# Patient Record
Sex: Female | Born: 1945 | Race: White | Hispanic: No | Marital: Married | Smoking: Former smoker
Health system: Southern US, Community
[De-identification: ages and names within clinical notes are randomized; demographics above are authoritative.]

## PROBLEM LIST (undated history)

## (undated) DIAGNOSIS — I10 Essential (primary) hypertension: Secondary | ICD-10-CM

## (undated) DIAGNOSIS — R011 Cardiac murmur, unspecified: Secondary | ICD-10-CM

## (undated) DIAGNOSIS — M719 Bursopathy, unspecified: Secondary | ICD-10-CM

## (undated) DIAGNOSIS — M1990 Unspecified osteoarthritis, unspecified site: Secondary | ICD-10-CM

## (undated) DIAGNOSIS — I8390 Asymptomatic varicose veins of unspecified lower extremity: Secondary | ICD-10-CM

## (undated) DIAGNOSIS — E785 Hyperlipidemia, unspecified: Secondary | ICD-10-CM

## (undated) DIAGNOSIS — Z8719 Personal history of other diseases of the digestive system: Secondary | ICD-10-CM

## (undated) DIAGNOSIS — M810 Age-related osteoporosis without current pathological fracture: Secondary | ICD-10-CM

## (undated) DIAGNOSIS — K635 Polyp of colon: Secondary | ICD-10-CM

## (undated) DIAGNOSIS — R3129 Other microscopic hematuria: Secondary | ICD-10-CM

## (undated) DIAGNOSIS — K219 Gastro-esophageal reflux disease without esophagitis: Secondary | ICD-10-CM

## (undated) DIAGNOSIS — M797 Fibromyalgia: Secondary | ICD-10-CM

## (undated) DIAGNOSIS — Z9889 Other specified postprocedural states: Secondary | ICD-10-CM

## (undated) HISTORY — PX: RETINAL DETACHMENT SURGERY: SHX105

## (undated) HISTORY — DX: Other microscopic hematuria: R31.29

## (undated) HISTORY — DX: Personal history of other diseases of the digestive system: Z87.19

## (undated) HISTORY — DX: Bursopathy, unspecified: M71.9

## (undated) HISTORY — DX: Fibromyalgia: M79.7

## (undated) HISTORY — DX: Polyp of colon: K63.5

## (undated) HISTORY — DX: Age-related osteoporosis without current pathological fracture: M81.0

## (undated) HISTORY — DX: Hyperlipidemia, unspecified: E78.5

## (undated) HISTORY — DX: Essential (primary) hypertension: I10

## (undated) HISTORY — DX: Unspecified osteoarthritis, unspecified site: M19.90

## (undated) HISTORY — DX: Asymptomatic varicose veins of unspecified lower extremity: I83.90

## (undated) HISTORY — DX: Other specified postprocedural states: Z98.890

---

## 1998-06-13 ENCOUNTER — Other Ambulatory Visit: Admission: RE | Admit: 1998-06-13 | Discharge: 1998-06-13 | Payer: Self-pay | Admitting: *Deleted

## 1999-06-24 ENCOUNTER — Other Ambulatory Visit: Admission: RE | Admit: 1999-06-24 | Discharge: 1999-06-24 | Payer: Self-pay | Admitting: *Deleted

## 2000-06-23 ENCOUNTER — Other Ambulatory Visit: Admission: RE | Admit: 2000-06-23 | Discharge: 2000-06-23 | Payer: Self-pay | Admitting: *Deleted

## 2001-02-23 ENCOUNTER — Ambulatory Visit (HOSPITAL_COMMUNITY): Admission: RE | Admit: 2001-02-23 | Discharge: 2001-02-23 | Payer: Self-pay | Admitting: Gastroenterology

## 2001-02-23 ENCOUNTER — Encounter (INDEPENDENT_AMBULATORY_CARE_PROVIDER_SITE_OTHER): Payer: Self-pay | Admitting: Specialist

## 2001-06-29 ENCOUNTER — Other Ambulatory Visit: Admission: RE | Admit: 2001-06-29 | Discharge: 2001-06-29 | Payer: Self-pay | Admitting: *Deleted

## 2002-06-14 ENCOUNTER — Other Ambulatory Visit: Admission: RE | Admit: 2002-06-14 | Discharge: 2002-06-14 | Payer: Self-pay | Admitting: *Deleted

## 2003-06-25 ENCOUNTER — Other Ambulatory Visit: Admission: RE | Admit: 2003-06-25 | Discharge: 2003-06-25 | Payer: Self-pay | Admitting: *Deleted

## 2004-01-30 ENCOUNTER — Ambulatory Visit (HOSPITAL_COMMUNITY): Admission: RE | Admit: 2004-01-30 | Discharge: 2004-01-31 | Payer: Self-pay | Admitting: Ophthalmology

## 2004-06-25 ENCOUNTER — Other Ambulatory Visit: Admission: RE | Admit: 2004-06-25 | Discharge: 2004-06-25 | Payer: Self-pay | Admitting: *Deleted

## 2005-06-24 ENCOUNTER — Other Ambulatory Visit: Admission: RE | Admit: 2005-06-24 | Discharge: 2005-06-24 | Payer: Self-pay | Admitting: *Deleted

## 2006-06-29 ENCOUNTER — Other Ambulatory Visit: Admission: RE | Admit: 2006-06-29 | Discharge: 2006-06-29 | Payer: Self-pay | Admitting: *Deleted

## 2007-07-13 ENCOUNTER — Other Ambulatory Visit: Admission: RE | Admit: 2007-07-13 | Discharge: 2007-07-13 | Payer: Self-pay | Admitting: *Deleted

## 2010-10-06 ENCOUNTER — Other Ambulatory Visit: Admission: RE | Admit: 2010-10-06 | Discharge: 2010-10-06 | Payer: Self-pay | Admitting: Internal Medicine

## 2011-04-24 NOTE — Op Note (Signed)
NAMELillie Boone NO.:  [id number]   MEDICAL RECORD NO.:  [id number]                   PATIENT TYPE:  OIB   LOCATION:  5736                                 FACILITY:  [facility]   PHYSICIAN:  Guadelupe Sabin, M.D.             DATE OF BIRTH:  1946-06-19   DATE OF PROCEDURE:  01/30/2004  DATE OF DISCHARGE:  01/31/2004                                 OPERATIVE REPORT   PREOPERATIVE DIAGNOSIS:  Rhegmatogenous retinal detachment, left eye.   POSTOPERATIVE DIAGNOSIS:  Rhegmatogenous retinal detachment, left eye.   NAME OF OPERATION:  Scleral buckling procedure, left eye, using solid  silicone implants, #277 and 240. Cryoapplication and diathermy application,  external drainage of subretinal fluid, intravitreous air injection.   SURGEON:  Guadelupe Sabin, M.D.   ASSISTANT:  Nurse.   ANESTHESIA:  General.   OPHTHALMOSCOPY:  AS previously described.   OPERATIVE PROCEDURE:  After the patient was prepped and draped, retraction  sutures were placed in the left upper and lower lids. A lid speculum was  inserted. A peritomy was performed adjacent to the limbus, 360 degrees. The  rectus muscles were isolated with 4-0 silk traction sutures. The sclerae was  inspected and felt to be of satisfactory thickness for a laminar scleral  dissection. Localization was then carried out using the retinal cryoprobe.  The tear area was localized at the 12:30 position as previously drawn. There  appeared to be multiple tiny retinal holes or tears in this area. The area  was treated with direct cryoapplication. It was then elected to perform  laminar scleral dissection from the 11 to 2:30 position, the bed measuring 9  mm in width. Light diathermy applications were applied to the intrascleral  lamina. A total of three 4-0 green [place] sutures were used to close the  flaps loosely with 3-0 plain catgut reinforcements over a trimmed #277 solid  silicone implant. A #240  solid silicone encircling band was placed about the  globe, tied with a suture at the 4 o'clock position. Anchoring sutures of 5-  0 white Dacron were placed at the 4:30 and 8 o'clock position to hold the  encircling band in place at the equator. After repeat indirect  ophthalmoscopy, it was elected to drain fluid in the bed at the 2:30  position at the end of the implant. Incision was made through the  intrascleral lamina and the choroid exposed, treated with light diathermy,  and then perforated with the pain electrode. An abundant amount of clear  subretinal fluid drained. The fundus was reinspected revealing still  considerable subretinal fluid. The drain site was reopened, and further  drainage carried out. The encircling band was adjusted slightly, but the eye  was quite hypotonus. It was therefore elected to inject air into the  vitreous cavity with a 30-gauge needle at the 8 o'clock position. The end of  the implant was  again slightly elevated and further subretinal fluid  drained. The eye once again became hypotonus, and it was elected to inject  approximately a total of 0.7 to 0.8 cc of air into the vitreous cavity.  Following this, it was felt that the retina __________ and retinal tears had  flattened significantly on the implant surface, and the rest of the residual  subretinal fluid would hopefully absorb spontaneously. It was therefore  elected to close. The scleral flaps were pulled up securely, and the tension  of the band inspected and felt to be in good position. Tenon's capsule was  then pulled forward in the four quadrants and tied as a separate layer with  4-0 mild chromic catgut sutures. The conjunctiva was then pulled forward and  closed with a running 6-0 mild chromic catgut suture. Fifty percent  Neosporin ophthalmic solution was irrigated in a subtenon space and over the  cornea. __________ Garamycin and dexamethasone were  injected into the subconjunctival  cul-de-sac. Maxitrol and atropine ointment  were instilled in the conjunctival cul-de-sac and a light patch applied.  Duration of procedure one and half hours. The patient tolerated the  procedure in general, left the operating room for the recovery room and  subsequently to the 23 hour observation unit.                                               Guadelupe Sabin, M.D.    HNJ/MEDQ  D:  01/31/2004  T:  01/31/2004  Job:  [id number]

## 2011-04-24 NOTE — Discharge Summary (Signed)
NAMELillie Fragmin NO.:  [id number]   MEDICAL RECORD NO.:  [id number]                   PATIENT TYPE:  OIB   LOCATION:  5736                                 FACILITY:  [facility]   PHYSICIAN:  Guadelupe Sabin, M.D.             DATE OF BIRTH:  February 26, 1946   DATE OF ADMISSION:  01/30/2004  DATE OF DISCHARGE:  01/31/2004                                 DISCHARGE SUMMARY   This was an urgent outpatient admission of this 65 year old white female  admitted with a rhegmatogenous retinal detachment of the left eye. The  patient had a scleral buckling procedure under general anesthesia without  complication. The patient was seen on the evening of surgery and the  following morning and felt to have achieved maximal hospital benefit. She  therefore was discharged home to be followed in the office.                                                Guadelupe Sabin, M.D.    HNJ/MEDQ  D:  01/31/2004  T:  01/31/2004  Job:  [id number]

## 2011-04-24 NOTE — H&P (Signed)
NAMELillie Boone NO.:  [id number]   MEDICAL RECORD NO.:  [id number]                   PATIENT TYPE:  OIB   LOCATION:  5736                                 FACILITY:  [facility]   PHYSICIAN:  Guadelupe Sabin, M.D.             DATE OF BIRTH:  June 30, 1946   DATE OF ADMISSION:  01/30/2004  DATE OF DISCHARGE:  01/31/2004                                HISTORY & PHYSICAL   REASON FOR ADMISSION:  This was an urgent outpatient admission of this 65-  year-old white female admitted with a rhegmatogenous retinal detachment of  the left eye.   HISTORY OF PRESENT ILLNESS:  This patient states that she was in good visual  health until approximately one week prior to admission when she began to  note the loss of vision and a visual field defect in her left eye. The  patient was seen by Dr. Stark Klein, [facility] optometrist, and found to  have a retinal detachment. The patient was referred to my office where this  diagnosis was confirmed and the patient admitted for surgery.   PAST MEDICAL HISTORY:  The patient is under the care of Dr. Vear Clock.  She is in good general health. The patient smokes one pack of cigarettes per  day.   CURRENT MEDICATIONS:  Advil, multivitamins, calcium, magnesium, circulation  tablets for her legs, and she has been taking TobraDex ophthalmic solution  one drop four times a day from Dr. Lawerance Bach for a possible corneal abrasion.   ALLERGIES:  Seasonal allergies and sinus trouble, PENICILLIN.   FAMILY HISTORY:  Negative for retinal detachment problems.   REVIEW OF SYSTEMS:  No cardiorespiratory complaints.   PHYSICAL EXAMINATION:  VITAL SIGNS:  As recorded on her chart.  GENERAL APPEARANCE:  The patient is a pleasant, thin, white female in acute  ocular distress.  HEENT:  Visual acuity without correction 20/400 right eye, finger counting  left eye; with correction 20/30 +2 right eye, finger counting left eye.  Applanation  tonometry 14 mm right eye, 11 left eye. Slit-lamp examination:  The eyes are white and clear with a clear cornea, deep and clear anterior  chamber, and clear lens. Detailed fundus examination:  Right eye reveals a  clear vitreous attached retina with normal optic nerve, blood vessels, and  macular; the left eye reveals a very bullous rhegmatogenous retinal  detachment extending from the 11:30 to 5:30 position. The macular area is  detached. At the 12:30 position is a retinal tear area or multiple small  tears.  CHEST:  Lungs clear to percussion and auscultation.  HEART:  Normal sinus rhythm. No cardiomegaly. No murmurs.  ABDOMEN:  Negative.  EXTREMITIES:  Negative.   ADMISSION DIAGNOSIS:  Rhegmatogenous retinal detachment, left eye.   SURGICAL PLAN:  Scleral buckling procedure with possible vitrectomy, left  eye. The patient and her husband have been given oral discussion and  printed  information concerning retinal detachment surgery and its complications. The  patient has been told that multiple surgical procedures are a possibility  and that it is a small percentage may not be able to have her retinal  detachment completely repaired. She understands also that her vision,  even with a successful retinal reattachment, may be decreased from her  previous visual acuity status. She has signed an informed consent, and  arrangements were made for outpatient here, as the patient did not wish to  travel to one of the medical schools for surgery.                                                Guadelupe Sabin, M.D.    HNJ/MEDQ  D:  01/31/2004  T:  01/31/2004  Job:  [id number]

## 2013-11-16 ENCOUNTER — Other Ambulatory Visit: Payer: Self-pay | Admitting: Internal Medicine

## 2013-11-16 ENCOUNTER — Other Ambulatory Visit (HOSPITAL_COMMUNITY)
Admission: RE | Admit: 2013-11-16 | Discharge: 2013-11-16 | Disposition: A | Discharge status: Home / Self Care | Payer: No Typology Code available for payment source | Source: Ambulatory Visit | Attending: Internal Medicine | Admitting: Internal Medicine

## 2013-11-16 DIAGNOSIS — Z1151 Encounter for screening for human papillomavirus (HPV): Secondary | ICD-10-CM | POA: Insufficient documentation

## 2013-11-16 DIAGNOSIS — Z01419 Encounter for gynecological examination (general) (routine) without abnormal findings: Secondary | ICD-10-CM | POA: Insufficient documentation

## 2014-12-12 DIAGNOSIS — M810 Age-related osteoporosis without current pathological fracture: Secondary | ICD-10-CM | POA: Diagnosis not present

## 2014-12-12 DIAGNOSIS — K219 Gastro-esophageal reflux disease without esophagitis: Secondary | ICD-10-CM | POA: Diagnosis not present

## 2014-12-12 DIAGNOSIS — I10 Essential (primary) hypertension: Secondary | ICD-10-CM | POA: Diagnosis not present

## 2014-12-12 DIAGNOSIS — Z23 Encounter for immunization: Secondary | ICD-10-CM | POA: Diagnosis not present

## 2014-12-12 DIAGNOSIS — Z0000 Encounter for general adult medical examination without abnormal findings: Secondary | ICD-10-CM | POA: Diagnosis not present

## 2014-12-12 DIAGNOSIS — M797 Fibromyalgia: Secondary | ICD-10-CM | POA: Diagnosis not present

## 2014-12-12 DIAGNOSIS — Z1389 Encounter for screening for other disorder: Secondary | ICD-10-CM | POA: Diagnosis not present

## 2014-12-12 DIAGNOSIS — E780 Pure hypercholesterolemia: Secondary | ICD-10-CM | POA: Diagnosis not present

## 2015-07-29 DIAGNOSIS — D2272 Melanocytic nevi of left lower limb, including hip: Secondary | ICD-10-CM | POA: Diagnosis not present

## 2015-07-29 DIAGNOSIS — L821 Other seborrheic keratosis: Secondary | ICD-10-CM | POA: Diagnosis not present

## 2015-07-29 DIAGNOSIS — D225 Melanocytic nevi of trunk: Secondary | ICD-10-CM | POA: Diagnosis not present

## 2015-07-29 DIAGNOSIS — D2262 Melanocytic nevi of left upper limb, including shoulder: Secondary | ICD-10-CM | POA: Diagnosis not present

## 2015-07-29 DIAGNOSIS — L812 Freckles: Secondary | ICD-10-CM | POA: Diagnosis not present

## 2015-07-29 DIAGNOSIS — L738 Other specified follicular disorders: Secondary | ICD-10-CM | POA: Diagnosis not present

## 2015-07-29 DIAGNOSIS — D1801 Hemangioma of skin and subcutaneous tissue: Secondary | ICD-10-CM | POA: Diagnosis not present

## 2015-07-29 DIAGNOSIS — D2261 Melanocytic nevi of right upper limb, including shoulder: Secondary | ICD-10-CM | POA: Diagnosis not present

## 2015-07-29 DIAGNOSIS — L918 Other hypertrophic disorders of the skin: Secondary | ICD-10-CM | POA: Diagnosis not present

## 2015-09-17 DIAGNOSIS — Z1231 Encounter for screening mammogram for malignant neoplasm of breast: Secondary | ICD-10-CM | POA: Diagnosis not present

## 2015-12-31 ENCOUNTER — Other Ambulatory Visit: Payer: Self-pay | Admitting: Internal Medicine

## 2015-12-31 ENCOUNTER — Ambulatory Visit
Admission: RE | Admit: 2015-12-31 | Discharge: 2015-12-31 | Disposition: A | Discharge status: Home / Self Care | Payer: Medicare Other | Source: Ambulatory Visit | Attending: Internal Medicine | Admitting: Internal Medicine

## 2015-12-31 DIAGNOSIS — M810 Age-related osteoporosis without current pathological fracture: Secondary | ICD-10-CM | POA: Diagnosis not present

## 2015-12-31 DIAGNOSIS — Z1389 Encounter for screening for other disorder: Secondary | ICD-10-CM | POA: Diagnosis not present

## 2015-12-31 DIAGNOSIS — Z23 Encounter for immunization: Secondary | ICD-10-CM | POA: Diagnosis not present

## 2015-12-31 DIAGNOSIS — M25562 Pain in left knee: Secondary | ICD-10-CM | POA: Diagnosis not present

## 2015-12-31 DIAGNOSIS — M797 Fibromyalgia: Secondary | ICD-10-CM | POA: Diagnosis not present

## 2015-12-31 DIAGNOSIS — E7800 Pure hypercholesterolemia, unspecified: Secondary | ICD-10-CM | POA: Diagnosis not present

## 2015-12-31 DIAGNOSIS — K219 Gastro-esophageal reflux disease without esophagitis: Secondary | ICD-10-CM | POA: Diagnosis not present

## 2015-12-31 DIAGNOSIS — Z6830 Body mass index (BMI) 30.0-30.9, adult: Secondary | ICD-10-CM | POA: Diagnosis not present

## 2015-12-31 DIAGNOSIS — Z0000 Encounter for general adult medical examination without abnormal findings: Secondary | ICD-10-CM | POA: Diagnosis not present

## 2015-12-31 DIAGNOSIS — I10 Essential (primary) hypertension: Secondary | ICD-10-CM | POA: Diagnosis not present

## 2015-12-31 DIAGNOSIS — E669 Obesity, unspecified: Secondary | ICD-10-CM | POA: Diagnosis not present

## 2016-01-20 DIAGNOSIS — M7062 Trochanteric bursitis, left hip: Secondary | ICD-10-CM | POA: Diagnosis not present

## 2016-01-20 DIAGNOSIS — M7632 Iliotibial band syndrome, left leg: Secondary | ICD-10-CM | POA: Diagnosis not present

## 2016-02-17 DIAGNOSIS — M7062 Trochanteric bursitis, left hip: Secondary | ICD-10-CM | POA: Diagnosis not present

## 2016-03-19 DIAGNOSIS — M7632 Iliotibial band syndrome, left leg: Secondary | ICD-10-CM | POA: Diagnosis not present

## 2016-03-19 DIAGNOSIS — M7062 Trochanteric bursitis, left hip: Secondary | ICD-10-CM | POA: Diagnosis not present

## 2016-05-08 DIAGNOSIS — R0781 Pleurodynia: Secondary | ICD-10-CM | POA: Diagnosis not present

## 2016-05-14 DIAGNOSIS — M545 Low back pain: Secondary | ICD-10-CM | POA: Diagnosis not present

## 2016-05-20 DIAGNOSIS — M545 Low back pain: Secondary | ICD-10-CM | POA: Diagnosis not present

## 2016-05-22 DIAGNOSIS — M545 Low back pain: Secondary | ICD-10-CM | POA: Diagnosis not present

## 2016-06-03 DIAGNOSIS — M545 Low back pain: Secondary | ICD-10-CM | POA: Diagnosis not present

## 2016-08-25 DIAGNOSIS — K219 Gastro-esophageal reflux disease without esophagitis: Secondary | ICD-10-CM | POA: Diagnosis not present

## 2016-08-25 DIAGNOSIS — I10 Essential (primary) hypertension: Secondary | ICD-10-CM | POA: Diagnosis not present

## 2016-09-14 DIAGNOSIS — Z8719 Personal history of other diseases of the digestive system: Secondary | ICD-10-CM | POA: Diagnosis not present

## 2016-09-14 DIAGNOSIS — Z86010 Personal history of colonic polyps: Secondary | ICD-10-CM | POA: Diagnosis not present

## 2016-09-14 DIAGNOSIS — Z800 Family history of malignant neoplasm of digestive organs: Secondary | ICD-10-CM | POA: Diagnosis not present

## 2016-09-14 DIAGNOSIS — Z8371 Family history of colonic polyps: Secondary | ICD-10-CM | POA: Diagnosis not present

## 2016-09-14 DIAGNOSIS — Z1211 Encounter for screening for malignant neoplasm of colon: Secondary | ICD-10-CM | POA: Diagnosis not present

## 2016-09-17 DIAGNOSIS — Z1231 Encounter for screening mammogram for malignant neoplasm of breast: Secondary | ICD-10-CM | POA: Diagnosis not present

## 2016-09-22 DIAGNOSIS — H2513 Age-related nuclear cataract, bilateral: Secondary | ICD-10-CM | POA: Diagnosis not present

## 2016-09-22 DIAGNOSIS — H524 Presbyopia: Secondary | ICD-10-CM | POA: Diagnosis not present

## 2016-09-23 DIAGNOSIS — M7062 Trochanteric bursitis, left hip: Secondary | ICD-10-CM | POA: Diagnosis not present

## 2017-01-05 DIAGNOSIS — E7800 Pure hypercholesterolemia, unspecified: Secondary | ICD-10-CM | POA: Diagnosis not present

## 2017-01-05 DIAGNOSIS — M797 Fibromyalgia: Secondary | ICD-10-CM | POA: Diagnosis not present

## 2017-01-05 DIAGNOSIS — M810 Age-related osteoporosis without current pathological fracture: Secondary | ICD-10-CM | POA: Diagnosis not present

## 2017-01-05 DIAGNOSIS — Z0000 Encounter for general adult medical examination without abnormal findings: Secondary | ICD-10-CM | POA: Diagnosis not present

## 2017-01-05 DIAGNOSIS — Z1389 Encounter for screening for other disorder: Secondary | ICD-10-CM | POA: Diagnosis not present

## 2017-01-05 DIAGNOSIS — I10 Essential (primary) hypertension: Secondary | ICD-10-CM | POA: Diagnosis not present

## 2017-01-05 DIAGNOSIS — K219 Gastro-esophageal reflux disease without esophagitis: Secondary | ICD-10-CM | POA: Diagnosis not present

## 2017-01-16 DIAGNOSIS — J069 Acute upper respiratory infection, unspecified: Secondary | ICD-10-CM | POA: Diagnosis not present

## 2017-01-16 DIAGNOSIS — R42 Dizziness and giddiness: Secondary | ICD-10-CM | POA: Diagnosis not present

## 2017-09-21 DIAGNOSIS — Z1231 Encounter for screening mammogram for malignant neoplasm of breast: Secondary | ICD-10-CM | POA: Diagnosis not present

## 2017-09-29 DIAGNOSIS — M7062 Trochanteric bursitis, left hip: Secondary | ICD-10-CM | POA: Diagnosis not present

## 2017-12-22 DIAGNOSIS — M7062 Trochanteric bursitis, left hip: Secondary | ICD-10-CM | POA: Diagnosis not present

## 2017-12-30 DIAGNOSIS — H524 Presbyopia: Secondary | ICD-10-CM | POA: Diagnosis not present

## 2017-12-30 DIAGNOSIS — H33302 Unspecified retinal break, left eye: Secondary | ICD-10-CM | POA: Diagnosis not present

## 2017-12-30 DIAGNOSIS — H2513 Age-related nuclear cataract, bilateral: Secondary | ICD-10-CM | POA: Diagnosis not present

## 2018-01-04 DIAGNOSIS — M797 Fibromyalgia: Secondary | ICD-10-CM | POA: Diagnosis not present

## 2018-01-04 DIAGNOSIS — E7800 Pure hypercholesterolemia, unspecified: Secondary | ICD-10-CM | POA: Diagnosis not present

## 2018-01-04 DIAGNOSIS — I10 Essential (primary) hypertension: Secondary | ICD-10-CM | POA: Diagnosis not present

## 2018-01-04 DIAGNOSIS — E663 Overweight: Secondary | ICD-10-CM | POA: Diagnosis not present

## 2018-01-04 DIAGNOSIS — K219 Gastro-esophageal reflux disease without esophagitis: Secondary | ICD-10-CM | POA: Diagnosis not present

## 2018-01-04 DIAGNOSIS — M5441 Lumbago with sciatica, right side: Secondary | ICD-10-CM | POA: Diagnosis not present

## 2018-01-04 DIAGNOSIS — Z23 Encounter for immunization: Secondary | ICD-10-CM | POA: Diagnosis not present

## 2018-01-05 DIAGNOSIS — I10 Essential (primary) hypertension: Secondary | ICD-10-CM | POA: Diagnosis not present

## 2018-02-14 DIAGNOSIS — R05 Cough: Secondary | ICD-10-CM | POA: Diagnosis not present

## 2018-02-14 DIAGNOSIS — J069 Acute upper respiratory infection, unspecified: Secondary | ICD-10-CM | POA: Diagnosis not present

## 2018-02-14 DIAGNOSIS — J22 Unspecified acute lower respiratory infection: Secondary | ICD-10-CM | POA: Diagnosis not present

## 2018-03-10 DIAGNOSIS — I83813 Varicose veins of bilateral lower extremities with pain: Secondary | ICD-10-CM | POA: Diagnosis not present

## 2018-03-10 DIAGNOSIS — I8311 Varicose veins of right lower extremity with inflammation: Secondary | ICD-10-CM | POA: Diagnosis not present

## 2018-03-10 DIAGNOSIS — I83893 Varicose veins of bilateral lower extremities with other complications: Secondary | ICD-10-CM | POA: Diagnosis not present

## 2018-03-10 DIAGNOSIS — I8312 Varicose veins of left lower extremity with inflammation: Secondary | ICD-10-CM | POA: Diagnosis not present

## 2018-03-16 DIAGNOSIS — I8311 Varicose veins of right lower extremity with inflammation: Secondary | ICD-10-CM | POA: Diagnosis not present

## 2018-03-16 DIAGNOSIS — I8312 Varicose veins of left lower extremity with inflammation: Secondary | ICD-10-CM | POA: Diagnosis not present

## 2018-03-24 DIAGNOSIS — I8312 Varicose veins of left lower extremity with inflammation: Secondary | ICD-10-CM | POA: Diagnosis not present

## 2018-03-24 DIAGNOSIS — I83893 Varicose veins of bilateral lower extremities with other complications: Secondary | ICD-10-CM | POA: Diagnosis not present

## 2018-03-24 DIAGNOSIS — I83813 Varicose veins of bilateral lower extremities with pain: Secondary | ICD-10-CM | POA: Diagnosis not present

## 2018-03-24 DIAGNOSIS — I8311 Varicose veins of right lower extremity with inflammation: Secondary | ICD-10-CM | POA: Diagnosis not present

## 2018-04-06 DIAGNOSIS — M7062 Trochanteric bursitis, left hip: Secondary | ICD-10-CM | POA: Diagnosis not present

## 2018-04-06 DIAGNOSIS — M7061 Trochanteric bursitis, right hip: Secondary | ICD-10-CM | POA: Diagnosis not present

## 2018-04-21 DIAGNOSIS — I83813 Varicose veins of bilateral lower extremities with pain: Secondary | ICD-10-CM | POA: Diagnosis not present

## 2018-04-21 DIAGNOSIS — I83893 Varicose veins of bilateral lower extremities with other complications: Secondary | ICD-10-CM | POA: Diagnosis not present

## 2018-04-21 DIAGNOSIS — I8312 Varicose veins of left lower extremity with inflammation: Secondary | ICD-10-CM | POA: Diagnosis not present

## 2018-04-21 DIAGNOSIS — I8311 Varicose veins of right lower extremity with inflammation: Secondary | ICD-10-CM | POA: Diagnosis not present

## 2018-05-11 DIAGNOSIS — I8312 Varicose veins of left lower extremity with inflammation: Secondary | ICD-10-CM | POA: Diagnosis not present

## 2018-05-11 DIAGNOSIS — I83892 Varicose veins of left lower extremities with other complications: Secondary | ICD-10-CM | POA: Diagnosis not present

## 2018-05-13 DIAGNOSIS — I8312 Varicose veins of left lower extremity with inflammation: Secondary | ICD-10-CM | POA: Diagnosis not present

## 2018-05-17 DIAGNOSIS — I8312 Varicose veins of left lower extremity with inflammation: Secondary | ICD-10-CM | POA: Diagnosis not present

## 2018-05-17 DIAGNOSIS — I83892 Varicose veins of left lower extremities with other complications: Secondary | ICD-10-CM | POA: Diagnosis not present

## 2018-05-25 DIAGNOSIS — I8312 Varicose veins of left lower extremity with inflammation: Secondary | ICD-10-CM | POA: Diagnosis not present

## 2018-06-07 DIAGNOSIS — M7981 Nontraumatic hematoma of soft tissue: Secondary | ICD-10-CM | POA: Diagnosis not present

## 2018-06-07 DIAGNOSIS — I83812 Varicose veins of left lower extremities with pain: Secondary | ICD-10-CM | POA: Diagnosis not present

## 2018-06-07 DIAGNOSIS — I8312 Varicose veins of left lower extremity with inflammation: Secondary | ICD-10-CM | POA: Diagnosis not present

## 2018-06-20 DIAGNOSIS — I8311 Varicose veins of right lower extremity with inflammation: Secondary | ICD-10-CM | POA: Diagnosis not present

## 2018-06-20 DIAGNOSIS — I83891 Varicose veins of right lower extremities with other complications: Secondary | ICD-10-CM | POA: Diagnosis not present

## 2018-06-20 DIAGNOSIS — I83811 Varicose veins of right lower extremities with pain: Secondary | ICD-10-CM | POA: Diagnosis not present

## 2018-06-22 DIAGNOSIS — I8311 Varicose veins of right lower extremity with inflammation: Secondary | ICD-10-CM | POA: Diagnosis not present

## 2018-07-12 DIAGNOSIS — M7632 Iliotibial band syndrome, left leg: Secondary | ICD-10-CM | POA: Diagnosis not present

## 2018-07-12 DIAGNOSIS — M797 Fibromyalgia: Secondary | ICD-10-CM | POA: Diagnosis not present

## 2018-07-12 DIAGNOSIS — E7800 Pure hypercholesterolemia, unspecified: Secondary | ICD-10-CM | POA: Diagnosis not present

## 2018-07-12 DIAGNOSIS — Z0000 Encounter for general adult medical examination without abnormal findings: Secondary | ICD-10-CM | POA: Diagnosis not present

## 2018-07-12 DIAGNOSIS — M810 Age-related osteoporosis without current pathological fracture: Secondary | ICD-10-CM | POA: Diagnosis not present

## 2018-07-12 DIAGNOSIS — I10 Essential (primary) hypertension: Secondary | ICD-10-CM | POA: Diagnosis not present

## 2018-07-12 DIAGNOSIS — Z1389 Encounter for screening for other disorder: Secondary | ICD-10-CM | POA: Diagnosis not present

## 2018-07-12 DIAGNOSIS — K219 Gastro-esophageal reflux disease without esophagitis: Secondary | ICD-10-CM | POA: Diagnosis not present

## 2018-07-15 DIAGNOSIS — I83811 Varicose veins of right lower extremities with pain: Secondary | ICD-10-CM | POA: Diagnosis not present

## 2018-07-15 DIAGNOSIS — I8311 Varicose veins of right lower extremity with inflammation: Secondary | ICD-10-CM | POA: Diagnosis not present

## 2018-07-18 DIAGNOSIS — I8311 Varicose veins of right lower extremity with inflammation: Secondary | ICD-10-CM | POA: Diagnosis not present

## 2018-07-27 DIAGNOSIS — I83891 Varicose veins of right lower extremities with other complications: Secondary | ICD-10-CM | POA: Diagnosis not present

## 2018-07-27 DIAGNOSIS — I8311 Varicose veins of right lower extremity with inflammation: Secondary | ICD-10-CM | POA: Diagnosis not present

## 2018-08-10 DIAGNOSIS — I83892 Varicose veins of left lower extremities with other complications: Secondary | ICD-10-CM | POA: Diagnosis not present

## 2018-08-10 DIAGNOSIS — M7981 Nontraumatic hematoma of soft tissue: Secondary | ICD-10-CM | POA: Diagnosis not present

## 2018-08-10 DIAGNOSIS — I8312 Varicose veins of left lower extremity with inflammation: Secondary | ICD-10-CM | POA: Diagnosis not present

## 2018-08-22 DIAGNOSIS — M7981 Nontraumatic hematoma of soft tissue: Secondary | ICD-10-CM | POA: Diagnosis not present

## 2018-08-22 DIAGNOSIS — I83811 Varicose veins of right lower extremities with pain: Secondary | ICD-10-CM | POA: Diagnosis not present

## 2018-08-22 DIAGNOSIS — I8311 Varicose veins of right lower extremity with inflammation: Secondary | ICD-10-CM | POA: Diagnosis not present

## 2018-08-22 DIAGNOSIS — I8312 Varicose veins of left lower extremity with inflammation: Secondary | ICD-10-CM | POA: Diagnosis not present

## 2018-09-05 DIAGNOSIS — M7981 Nontraumatic hematoma of soft tissue: Secondary | ICD-10-CM | POA: Diagnosis not present

## 2018-09-05 DIAGNOSIS — I8311 Varicose veins of right lower extremity with inflammation: Secondary | ICD-10-CM | POA: Diagnosis not present

## 2018-09-05 DIAGNOSIS — I83811 Varicose veins of right lower extremities with pain: Secondary | ICD-10-CM | POA: Diagnosis not present

## 2018-09-22 DIAGNOSIS — Z1231 Encounter for screening mammogram for malignant neoplasm of breast: Secondary | ICD-10-CM | POA: Diagnosis not present

## 2018-09-23 DIAGNOSIS — M7062 Trochanteric bursitis, left hip: Secondary | ICD-10-CM | POA: Diagnosis not present

## 2018-10-12 DIAGNOSIS — M7072 Other bursitis of hip, left hip: Secondary | ICD-10-CM | POA: Diagnosis not present

## 2018-10-12 DIAGNOSIS — M47817 Spondylosis without myelopathy or radiculopathy, lumbosacral region: Secondary | ICD-10-CM | POA: Diagnosis not present

## 2018-10-25 DIAGNOSIS — M47817 Spondylosis without myelopathy or radiculopathy, lumbosacral region: Secondary | ICD-10-CM | POA: Diagnosis not present

## 2018-10-25 DIAGNOSIS — M7072 Other bursitis of hip, left hip: Secondary | ICD-10-CM | POA: Diagnosis not present

## 2018-10-27 DIAGNOSIS — M47817 Spondylosis without myelopathy or radiculopathy, lumbosacral region: Secondary | ICD-10-CM | POA: Diagnosis not present

## 2018-10-27 DIAGNOSIS — M7072 Other bursitis of hip, left hip: Secondary | ICD-10-CM | POA: Diagnosis not present

## 2018-11-01 DIAGNOSIS — M7072 Other bursitis of hip, left hip: Secondary | ICD-10-CM | POA: Diagnosis not present

## 2018-11-01 DIAGNOSIS — M47817 Spondylosis without myelopathy or radiculopathy, lumbosacral region: Secondary | ICD-10-CM | POA: Diagnosis not present

## 2018-11-10 DIAGNOSIS — M47817 Spondylosis without myelopathy or radiculopathy, lumbosacral region: Secondary | ICD-10-CM | POA: Diagnosis not present

## 2018-11-10 DIAGNOSIS — M7072 Other bursitis of hip, left hip: Secondary | ICD-10-CM | POA: Diagnosis not present

## 2018-11-15 DIAGNOSIS — M47817 Spondylosis without myelopathy or radiculopathy, lumbosacral region: Secondary | ICD-10-CM | POA: Diagnosis not present

## 2018-11-15 DIAGNOSIS — M7072 Other bursitis of hip, left hip: Secondary | ICD-10-CM | POA: Diagnosis not present

## 2018-11-17 DIAGNOSIS — M7072 Other bursitis of hip, left hip: Secondary | ICD-10-CM | POA: Diagnosis not present

## 2018-11-17 DIAGNOSIS — M47817 Spondylosis without myelopathy or radiculopathy, lumbosacral region: Secondary | ICD-10-CM | POA: Diagnosis not present

## 2018-11-23 DIAGNOSIS — M47817 Spondylosis without myelopathy or radiculopathy, lumbosacral region: Secondary | ICD-10-CM | POA: Diagnosis not present

## 2018-11-23 DIAGNOSIS — M7072 Other bursitis of hip, left hip: Secondary | ICD-10-CM | POA: Diagnosis not present

## 2018-12-13 DIAGNOSIS — M47817 Spondylosis without myelopathy or radiculopathy, lumbosacral region: Secondary | ICD-10-CM | POA: Diagnosis not present

## 2018-12-13 DIAGNOSIS — M7072 Other bursitis of hip, left hip: Secondary | ICD-10-CM | POA: Diagnosis not present

## 2018-12-14 DIAGNOSIS — M1612 Unilateral primary osteoarthritis, left hip: Secondary | ICD-10-CM | POA: Diagnosis not present

## 2018-12-21 DIAGNOSIS — I8311 Varicose veins of right lower extremity with inflammation: Secondary | ICD-10-CM | POA: Diagnosis not present

## 2018-12-21 DIAGNOSIS — I8312 Varicose veins of left lower extremity with inflammation: Secondary | ICD-10-CM | POA: Diagnosis not present

## 2018-12-21 DIAGNOSIS — I83813 Varicose veins of bilateral lower extremities with pain: Secondary | ICD-10-CM | POA: Diagnosis not present

## 2018-12-30 ENCOUNTER — Other Ambulatory Visit: Payer: Self-pay | Admitting: Orthopaedic Surgery

## 2019-01-03 DIAGNOSIS — H04123 Dry eye syndrome of bilateral lacrimal glands: Secondary | ICD-10-CM | POA: Diagnosis not present

## 2019-01-03 DIAGNOSIS — H25013 Cortical age-related cataract, bilateral: Secondary | ICD-10-CM | POA: Diagnosis not present

## 2019-01-03 DIAGNOSIS — H524 Presbyopia: Secondary | ICD-10-CM | POA: Diagnosis not present

## 2019-01-11 NOTE — Patient Instructions (Signed)
Julia Boone  01/11/2019   Your procedure is scheduled on: 01-24-19   Report to [facility] Main  Entrance                 Report to admitting at           1020  AM     Call this number if you have problems the morning of surgery [phone]    Remember: Do not eat food or drink liquids :After Midnight.  BRUSH YOUR TEETH MORNING OF SURGERY AND RINSE YOUR MOUTH OUT, NO CHEWING GUM CANDY OR MINTS.     Take these medicines the morning of surgery with A SIP OF WATER: omeprazole if needed                                You may not have any metal on your body including hair pins and              piercings  Do not wear jewelry, make-up, lotions, powders or perfumes, deodorant             Do not wear nail polish.  Do not shave  48 hours prior to surgery.     Do not bring valuables to the hospital. [place].  Contacts, dentures or bridgework may not be worn into surgery.  Leave suitcase in the car. After surgery it may be brought to your room.                  Please read over the following fact sheets you were given: _____________________________________________________________________             [facility] - Preparing for Surgery Before surgery, you can play an important role.  Because skin is not sterile, your skin needs to be as free of germs as possible.  You can reduce the number of germs on your skin by washing with CHG (chlorahexidine gluconate) soap before surgery.  CHG is an antiseptic cleaner which kills germs and bonds with the skin to continue killing germs even after washing. Please DO NOT use if you have an allergy to CHG or antibacterial soaps.  If your skin becomes reddened/irritated stop using the CHG and inform your nurse when you arrive at Short Stay. Do not shave (including legs and underarms) for at least 48 hours prior to the first CHG shower.  You may shave your face/neck. Please  follow these instructions carefully:  1.  Shower with CHG Soap the night before surgery and the  morning of Surgery.  2.  If you choose to wash your hair, wash your hair first as usual with your  normal  shampoo.  3.  After you shampoo, rinse your hair and body thoroughly to remove the  shampoo.                           4.  Use CHG as you would any other liquid soap.  You can apply chg directly  to the skin and wash                       Gently with a scrungie or clean washcloth.  5.  Apply the CHG Soap to your body ONLY FROM THE NECK DOWN.   Do not use on face/ open                           Wound or open sores. Avoid contact with eyes, ears mouth and genitals (private parts).                       Wash face,  Genitals (private parts) with your normal soap.             6.  Wash thoroughly, paying special attention to the area where your surgery  will be performed.  7.  Thoroughly rinse your body with warm water from the neck down.  8.  DO NOT shower/wash with your normal soap after using and rinsing off  the CHG Soap.                9.  Pat yourself dry with a clean towel.            10.  Wear clean pajamas.            11.  Place clean sheets on your bed the night of your first shower and do not  sleep with pets. Day of Surgery : Do not apply any lotions/deodorants the morning of surgery.  Please wear clean clothes to the hospital/surgery center.  FAILURE TO FOLLOW THESE INSTRUCTIONS MAY RESULT IN THE CANCELLATION OF YOUR SURGERY PATIENT SIGNATURE_________________________________  NURSE SIGNATURE__________________________________  ________________________________________________________________________  WHAT IS A BLOOD TRANSFUSION? Blood Transfusion Information  A transfusion is the replacement of blood or some of its parts. Blood is made up of multiple cells which provide different functions.  Red blood cells carry oxygen and are used for blood loss replacement.  White blood cells  fight against infection.  Platelets control bleeding.  Plasma helps clot blood.  Other blood products are available for specialized needs, such as hemophilia or other clotting disorders. BEFORE THE TRANSFUSION  Who gives blood for transfusions?   Healthy volunteers who are fully evaluated to make sure their blood is safe. This is blood bank blood. Transfusion therapy is the safest it has ever been in the practice of medicine. Before blood is taken from a donor, a complete history is taken to make sure that person has no history of diseases nor engages in risky social behavior (examples are intravenous drug use or sexual activity with multiple partners). The donor's travel history is screened to minimize risk of transmitting infections, such as malaria. The donated blood is tested for signs of infectious diseases, such as HIV and hepatitis. The blood is then tested to be sure it is compatible with you in order to minimize the chance of a transfusion reaction. If you or a relative donates blood, this is often done in anticipation of surgery and is not appropriate for emergency situations. It takes many days to process the donated blood. RISKS AND COMPLICATIONS Although transfusion therapy is very safe and saves many lives, the main dangers of transfusion include:   Getting an infectious disease.  Developing a transfusion reaction. This is an allergic reaction to something in the blood you were given. Every precaution is taken to prevent this. The decision to have a blood transfusion has been considered carefully by your caregiver before blood is given. Blood is not given unless the benefits outweigh the risks. AFTER THE TRANSFUSION  Right after  receiving a blood transfusion, you will usually feel much better and more energetic. This is especially true if your red blood cells have gotten low (anemic). The transfusion raises the level of the red blood cells which carry oxygen, and this usually  causes an energy increase.  The nurse administering the transfusion will monitor you carefully for complications. HOME CARE INSTRUCTIONS  No special instructions are needed after a transfusion. You may find your energy is better. Speak with your caregiver about any limitations on activity for underlying diseases you may have. SEEK MEDICAL CARE IF:   Your condition is not improving after your transfusion.  You develop redness or irritation at the intravenous (IV) site. SEEK IMMEDIATE MEDICAL CARE IF:  Any of the following symptoms occur over the next 12 hours:  Shaking chills.  You have a temperature by mouth above 102 F (38.9 C), not controlled by medicine.  Chest, back, or muscle pain.  People around you feel you are not acting correctly or are confused.  Shortness of breath or difficulty breathing.  Dizziness and fainting.  You get a rash or develop hives.  You have a decrease in urine output.  Your urine turns a dark color or changes to pink, red, or brown. Any of the following symptoms occur over the next 10 days:  You have a temperature by mouth above 102 F (38.9 C), not controlled by medicine.  Shortness of breath.  Weakness after normal activity.  The white part of the eye turns yellow (jaundice).  You have a decrease in the amount of urine or are urinating less often.  Your urine turns a dark color or changes to pink, red, or brown. Document Released: 11/20/2000 Document Revised: 02/15/2012 Document Reviewed: 07/09/2008 Exit[employer] Patient Information 2014 [place].  _______________________________________________________________________  Incentive Spirometer  An incentive spirometer is a tool that can help keep your lungs clear and active. This tool measures how well you are filling your lungs with each breath. Taking long deep breaths may help reverse or decrease the chance of developing breathing (pulmonary) problems (especially infection)  following:  A long period of time when you are unable to move or be active. BEFORE THE PROCEDURE   If the spirometer includes an indicator to show your best effort, your nurse or respiratory therapist will set it to a desired goal.  If possible, sit up straight or lean slightly forward. Try not to slouch.  Hold the incentive spirometer in an upright position. INSTRUCTIONS FOR USE  1. Sit on the edge of your bed if possible, or sit up as far as you can in bed or on a chair. 2. Hold the incentive spirometer in an upright position. 3. Breathe out normally. 4. Place the mouthpiece in your mouth and seal your lips tightly around it. 5. Breathe in slowly and as deeply as possible, raising the piston or the ball toward the top of the column. 6. Hold your breath for 3-5 seconds or for as long as possible. Allow the piston or ball to fall to the bottom of the column. 7. Remove the mouthpiece from your mouth and breathe out normally. 8. Rest for a few seconds and repeat Steps 1 through 7 at least 10 times every 1-2 hours when you are awake. Take your time and take a few normal breaths between deep breaths. 9. The spirometer may include an indicator to show your best effort. Use the indicator as a goal to work toward during each repetition. 10. After each set  of 10 deep breaths, practice coughing to be sure your lungs are clear. If you have an incision (the cut made at the time of surgery), support your incision when coughing by placing a pillow or rolled up towels firmly against it. Once you are able to get out of bed, walk around indoors and cough well. You may stop using the incentive spirometer when instructed by your caregiver.  RISKS AND COMPLICATIONS  Take your time so you do not get dizzy or light-headed.  If you are in pain, you may need to take or ask for pain medication before doing incentive spirometry. It is harder to take a deep breath if you are having pain. AFTER USE  Rest and  breathe slowly and easily.  It can be helpful to keep track of a log of your progress. Your caregiver can provide you with a simple table to help with this. If you are using the spirometer at home, follow these instructions: [place] IF:   You are having difficultly using the spirometer.  You have trouble using the spirometer as often as instructed.  Your pain medication is not giving enough relief while using the spirometer.  You develop fever of 100.5 F (38.1 C) or higher. SEEK IMMEDIATE MEDICAL CARE IF:   You cough up bloody sputum that had not been present before.  You develop fever of 102 F (38.9 C) or greater.  You develop worsening pain at or near the incision site. MAKE SURE YOU:   Understand these instructions.  Will watch your condition.  Will get help right away if you are not doing well or get worse. Document Released: 04/05/2007 Document Revised: 02/15/2012 Document Reviewed: 06/06/2007 [facility] Patient Information 2014 [place], [place].   ________________________________________________________________________

## 2019-01-16 ENCOUNTER — Encounter (HOSPITAL_COMMUNITY): Payer: Self-pay

## 2019-01-16 ENCOUNTER — Ambulatory Visit (HOSPITAL_COMMUNITY)
Admission: RE | Admit: 2019-01-16 | Discharge: 2019-01-16 | Disposition: A | Discharge status: Home / Self Care | Payer: Medicare Other | Source: Ambulatory Visit | Attending: Orthopaedic Surgery | Admitting: Orthopaedic Surgery

## 2019-01-16 ENCOUNTER — Encounter (HOSPITAL_COMMUNITY)
Admission: RE | Admit: 2019-01-16 | Discharge: 2019-01-16 | Disposition: A | Discharge status: Home / Self Care | Payer: Medicare Other | Source: Ambulatory Visit

## 2019-01-16 ENCOUNTER — Other Ambulatory Visit: Payer: Self-pay

## 2019-01-16 DIAGNOSIS — Z01818 Encounter for other preprocedural examination: Secondary | ICD-10-CM

## 2019-01-16 DIAGNOSIS — R011 Cardiac murmur, unspecified: Secondary | ICD-10-CM | POA: Diagnosis not present

## 2019-01-16 DIAGNOSIS — Z01811 Encounter for preprocedural respiratory examination: Secondary | ICD-10-CM | POA: Diagnosis not present

## 2019-01-16 HISTORY — DX: Gastro-esophageal reflux disease without esophagitis: K21.9

## 2019-01-16 HISTORY — DX: Cardiac murmur, unspecified: R01.1

## 2019-01-16 HISTORY — DX: Unspecified osteoarthritis, unspecified site: M19.90

## 2019-01-16 LAB — URINALYSIS, ROUTINE W REFLEX MICROSCOPIC
Bilirubin Urine: NEGATIVE
Glucose, UA: NEGATIVE mg/dL
Ketones, ur: NEGATIVE mg/dL
Nitrite: NEGATIVE
Protein, ur: 30 mg/dL — AB
Specific Gravity, Urine: 1.021 (ref 1.005–1.030)
WBC, UA: >50 WBC/hpf — ABNORMAL HIGH (ref 0–5)
pH: 5 (ref 5.0–8.0)

## 2019-01-16 LAB — BASIC METABOLIC PANEL
Anion gap: 9 (ref 5–15)
BUN: 23 mg/dL (ref 8–23)
CO2: 27 mmol/L (ref 22–32)
Calcium: 9.8 mg/dL (ref 8.9–10.3)
Chloride: 102 mmol/L (ref 98–111)
Creatinine, Ser: 0.86 mg/dL (ref 0.44–1.00)
GFR calc Af Amer: >60 mL/min (ref >60)
GFR calc non Af Amer: >60 mL/min (ref >60)
Glucose, Bld: 95 mg/dL (ref 70–99)
Potassium: 5 mmol/L (ref 3.5–5.1)
Sodium: 138 mmol/L (ref 135–145)

## 2019-01-16 LAB — CBC WITH DIFFERENTIAL/PLATELET
Abs Immature Granulocytes: 0.05 10*3/uL (ref 0.00–0.07)
Basophils Absolute: 0.1 10*3/uL (ref 0.0–0.1)
Basophils Relative: 1 %
Eosinophils Absolute: 0.1 10*3/uL (ref 0.0–0.5)
Eosinophils Relative: 2 %
HCT: 44.5 % (ref 36.0–46.0)
Hemoglobin: 13.6 g/dL (ref 12.0–15.0)
Immature Granulocytes: 1 %
Lymphocytes Relative: 12 %
Lymphs Abs: 0.9 10*3/uL (ref 0.7–4.0)
MCH: 28.5 pg (ref 26.0–34.0)
MCHC: 30.6 g/dL (ref 30.0–36.0)
MCV: 93.1 fL (ref 80.0–100.0)
Monocytes Absolute: 0.5 10*3/uL (ref 0.1–1.0)
Monocytes Relative: 7 %
Neutro Abs: 5.5 10*3/uL (ref 1.7–7.7)
Neutrophils Relative %: 77 %
Platelets: 362 10*3/uL (ref 150–400)
RBC: 4.78 MIL/uL (ref 3.87–5.11)
RDW: 12.9 % (ref 11.5–15.5)
WBC: 7.1 10*3/uL (ref 4.0–10.5)
nRBC: 0 % (ref 0.0–0.2)

## 2019-01-16 LAB — SURGICAL PCR SCREEN
MRSA, PCR: NEGATIVE
Staphylococcus aureus: NEGATIVE

## 2019-01-16 LAB — PROTIME-INR
INR: 0.97
Prothrombin Time: 12.8 seconds (ref 11.4–15.2)

## 2019-01-16 LAB — ABO/RH: ABO/RH(D): O POS

## 2019-01-16 LAB — APTT: aPTT: 37 seconds — ABNORMAL HIGH (ref 24–36)

## 2019-01-17 NOTE — Progress Notes (Signed)
CXW:NPIO [employer]  CARDIOLOGIST:none  INFO IN Epic: PTT 37, UA abnormal  INFO ON CHART:N/A  BLOOD THINNERS AND LAST DOSES:none ____________________________________  PATIENT SYMPTOMS AT TIME OF PREOP:none

## 2019-01-18 DIAGNOSIS — K219 Gastro-esophageal reflux disease without esophagitis: Secondary | ICD-10-CM | POA: Diagnosis not present

## 2019-01-18 DIAGNOSIS — Z23 Encounter for immunization: Secondary | ICD-10-CM | POA: Diagnosis not present

## 2019-01-18 DIAGNOSIS — I10 Essential (primary) hypertension: Secondary | ICD-10-CM | POA: Diagnosis not present

## 2019-01-19 ENCOUNTER — Other Ambulatory Visit: Payer: Self-pay | Admitting: Orthopaedic Surgery

## 2019-01-19 NOTE — Care Plan (Signed)
Spoke with patient prior to surgery. She will discharge to home with family and HHPT. She has an OPPT appointment in place if further therapy is needed. Rolling walker has been ordered. Patient is aware and agreeable. Choice offered.   Ladell Heads, [place]

## 2019-01-23 MED ORDER — BUPIVACAINE LIPOSOME 1.3 % IJ SUSP
10.0000 mL | Freq: Once | INTRAMUSCULAR | Status: DC
  Filled 2019-01-23: qty 10

## 2019-01-23 MED ORDER — TRANEXAMIC ACID 1000 MG/10ML IV SOLN
2000.0000 mg | INTRAVENOUS | Status: DC
  Filled 2019-01-23: qty 20

## 2019-01-23 NOTE — H&P (Signed)
TOTAL HIP ADMISSION H&P  Patient is admitted for left total hip arthroplasty.  Subjective:  Chief Complaint: left hip pain  HPI: Julia Boone, 73 y.o. female, has a history of pain and functional disability in the left hip(s) due to arthritis and patient has failed non-surgical conservative treatments for greater than 12 weeks to include NSAID's and/or analgesics, corticosteriod injections, flexibility and strengthening excercises, use of assistive devices, weight reduction as appropriate and activity modification.  Onset of symptoms was gradual starting 5 years ago with gradually worsening course since that time.The patient noted no past surgery on the left hip(s).  Patient currently rates pain in the left hip at 10 out of 10 with activity. Patient has night pain, worsening of pain with activity and weight bearing, trendelenberg gait, pain that interfers with activities of daily living and crepitus. Patient has evidence of subchondral cysts, subchondral sclerosis, periarticular osteophytes and joint space narrowing by imaging studies. This condition presents safety issues increasing the risk of falls. There is no current active infection.  There are no active problems to display for this patient.  Past Medical History:  Diagnosis Date  . Arthritis   . Bursitis    left hip,Gioffre  . DJD (degenerative joint disease)   . Fibromyalgia   . GERD (gastroesophageal reflux disease)   . H/O gastroesophageal reflux (GERD)   . Heart murmur   . Hx of cystoscopy    negative  . Hyperlipidemia   . Hypertension   . Microhematuria    Nesi, CT right renal cyst,  . Osteoporosis   . Polyp of colon, hyperplastic   . Varicose veins     Past Surgical History:  Procedure Laterality Date  . RETINAL DETACHMENT SURGERY     2014    Current Facility-Administered Medications  Medication Dose Route Frequency Provider Last Rate Last Dose  . [START ON 01/24/2019] bupivacaine liposome (EXPAREL) 1.3 %  injection 133 mg  10 mL Infiltration Once Melrose Nakayama, MD      . Derrill Memo ON 01/24/2019] tranexamic acid (CYKLOKAPRON) 2,000 mg in sodium chloride 0.9 % 50 mL Topical Application  5,631 mg Topical To OR Melrose Nakayama, MD       Current Outpatient Medications  Medication Sig Dispense Refill Last Dose  . acetaminophen (TYLENOL) 500 MG tablet Take 1,000 mg by mouth 2 (two) times daily as needed for moderate pain.     Marland Kitchen atorvastatin (LIPITOR) 10 MG tablet Take 10 mg by mouth at bedtime.     . Calcium Citrate-Vitamin D (CALCIUM + D PO) Take 1 tablet by mouth at bedtime.     . DULoxetine (CYMBALTA) 60 MG capsule Take 60 mg by mouth at bedtime.     . hydrochlorothiazide (MICROZIDE) 12.5 MG capsule Take 12.5 mg by mouth at bedtime.     Marland Kitchen ibuprofen (ADVIL,MOTRIN) 200 MG tablet Take 400 mg by mouth 2 (two) times daily as needed for moderate pain.     Marland Kitchen losartan (COZAAR) 50 MG tablet Take 50 mg by mouth at bedtime.     . Multiple Vitamin (MULTIVITAMIN WITH MINERALS) TABS tablet Take 1 tablet by mouth at bedtime.     Marland Kitchen omeprazole (PRILOSEC) 20 MG capsule Take 20 mg by mouth daily as needed (acid reflux).     . Polyethylene Glycol 400 (BLINK TEARS) 0.25 % SOLN Place 1 drop into both eyes daily as needed (dry eyes).     . potassium chloride (K-DUR,KLOR-CON) 10 MEQ tablet Take 10 mEq by mouth at bedtime.     Marland Kitchen  alendronate (FOSAMAX) 70 MG tablet Take 70 mg by mouth once a week. Take with a full glass of water on an empty stomach.   Taking  . aspirin 81 MG tablet Take 81 mg by mouth daily.   Taking  . atorvastatin (LIPITOR) 10 MG tablet Take 10 mg by mouth daily.   Taking  . cyclobenzaprine (FLEXERIL) 10 MG tablet Take 10 mg by mouth 3 (three) times daily as needed for muscle spasms.   Taking  . DULoxetine (CYMBALTA) 60 MG capsule Take 60 mg by mouth daily.   Taking  . losartan-hydrochlorothiazide (HYZAAR) 50-12.5 MG per tablet Take 1 tablet by mouth daily.   Taking  . omeprazole (PRILOSEC) 20 MG capsule Take  20 mg by mouth daily.   Taking   Allergies  Allergen Reactions  . Penicillins Anaphylaxis and Hives    Did it involve swelling of the face/tongue/throat, SOB, or low BP? Yes Did it involve sudden or severe rash/hives, skin peeling, or any reaction on the inside of your mouth or nose? Yes Did you need to seek medical attention at a hospital or doctor's office? Unknown When did it last happen?Childhood allergy If all above answers are "NO", may proceed with cephalosporin use.   Marland Kitchen Lisinopril Cough  . Penicillins     Rash /swelling     Social History   Tobacco Use  . Smoking status: Former Smoker    Years: 20.00    Types: Cigarettes    Last attempt to quit: 01/17/2004    Years since quitting: 15.0  . Smokeless tobacco: Never Used  Substance Use Topics  . Alcohol use: Not Currently    No family history on file.   Review of Systems  Musculoskeletal: Positive for joint pain.       Left hip  All other systems reviewed and are negative.   Objective:  Physical Exam  Constitutional: She is oriented to person, place, and time. She appears well-developed and well-nourished.  HENT:  Head: Normocephalic and atraumatic.  Eyes: Pupils are equal, round, and reactive to light.  Neck: Normal range of motion.  Cardiovascular: Normal rate and regular rhythm.  Respiratory: Effort normal.  GI: Soft.  Musculoskeletal:     Comments: Left hip motion is limited and fairly painful.  She has pain at the groin but also over her greater trochanter.  Straight leg raise causes low back and hip pain only.  Sensation and motor function are intact distally.  She has palpable pulses in her feet.    Neurological: She is alert and oriented to person, place, and time.  Skin: Skin is warm and dry.  Psychiatric: She has a normal mood and affect. Her behavior is normal. Judgment and thought content normal.    Vital signs in last 24 hours:    Labs:   Estimated body mass index is 26.75 kg/m as  calculated from the following:   Height as of 01/16/19: 5\' 3"  (1.6 m).   Weight as of 01/16/19: 68.5 kg.   Imaging Review Plain radiographs demonstrate severe degenerative joint disease of the left hip(s). The bone quality appears to be good for age and reported activity level.      Assessment/Plan:  End stage primary arthritis, left hip(s)  The patient history, physical examination, clinical judgement of the provider and imaging studies are consistent with end stage degenerative joint disease of the left hip(s) and total hip arthroplasty is deemed medically necessary. The treatment options including medical management, injection therapy, arthroscopy and  arthroplasty were discussed at length. The risks and benefits of total hip arthroplasty were presented and reviewed. The risks due to aseptic loosening, infection, stiffness, dislocation/subluxation,  thromboembolic complications and other imponderables were discussed.  The patient acknowledged the explanation, agreed to proceed with the plan and consent was signed. Patient is being admitted for inpatient treatment for surgery, pain control, PT, OT, prophylactic antibiotics, VTE prophylaxis, progressive ambulation and ADL's and discharge planning.The patient is planning to be discharged home with home health services

## 2019-01-24 ENCOUNTER — Inpatient Hospital Stay (HOSPITAL_COMMUNITY): Payer: Medicare Other | Admitting: Physician Assistant

## 2019-01-24 ENCOUNTER — Observation Stay (HOSPITAL_COMMUNITY)
Admission: RE | Admit: 2019-01-24 | Discharge: 2019-01-25 | Disposition: A | Discharge status: Home / Self Care | Payer: Medicare Other | Attending: Orthopaedic Surgery | Admitting: Orthopaedic Surgery

## 2019-01-24 ENCOUNTER — Encounter (HOSPITAL_COMMUNITY)
Admission: RE | Disposition: A | Discharge status: Home / Self Care | Payer: Self-pay | Source: Home / Self Care | Attending: Orthopaedic Surgery

## 2019-01-24 ENCOUNTER — Encounter (HOSPITAL_COMMUNITY): Payer: Self-pay

## 2019-01-24 ENCOUNTER — Other Ambulatory Visit: Payer: Self-pay

## 2019-01-24 ENCOUNTER — Inpatient Hospital Stay (HOSPITAL_COMMUNITY): Payer: Medicare Other

## 2019-01-24 ENCOUNTER — Inpatient Hospital Stay (HOSPITAL_COMMUNITY): Payer: Medicare Other | Admitting: Anesthesiology

## 2019-01-24 DIAGNOSIS — M810 Age-related osteoporosis without current pathological fracture: Secondary | ICD-10-CM | POA: Insufficient documentation

## 2019-01-24 DIAGNOSIS — Z880 Allergy status to penicillin: Secondary | ICD-10-CM | POA: Diagnosis not present

## 2019-01-24 DIAGNOSIS — R011 Cardiac murmur, unspecified: Secondary | ICD-10-CM | POA: Insufficient documentation

## 2019-01-24 DIAGNOSIS — Z79899 Other long term (current) drug therapy: Secondary | ICD-10-CM | POA: Diagnosis not present

## 2019-01-24 DIAGNOSIS — Z86010 Personal history of colonic polyps: Secondary | ICD-10-CM | POA: Insufficient documentation

## 2019-01-24 DIAGNOSIS — Z471 Aftercare following joint replacement surgery: Secondary | ICD-10-CM | POA: Diagnosis not present

## 2019-01-24 DIAGNOSIS — Z87891 Personal history of nicotine dependence: Secondary | ICD-10-CM | POA: Insufficient documentation

## 2019-01-24 DIAGNOSIS — M1612 Unilateral primary osteoarthritis, left hip: Secondary | ICD-10-CM | POA: Diagnosis not present

## 2019-01-24 DIAGNOSIS — K219 Gastro-esophageal reflux disease without esophagitis: Secondary | ICD-10-CM | POA: Insufficient documentation

## 2019-01-24 DIAGNOSIS — M25559 Pain in unspecified hip: Secondary | ICD-10-CM

## 2019-01-24 DIAGNOSIS — Z888 Allergy status to other drugs, medicaments and biological substances status: Secondary | ICD-10-CM | POA: Insufficient documentation

## 2019-01-24 DIAGNOSIS — I700 Atherosclerosis of aorta: Secondary | ICD-10-CM | POA: Insufficient documentation

## 2019-01-24 DIAGNOSIS — E785 Hyperlipidemia, unspecified: Secondary | ICD-10-CM | POA: Diagnosis not present

## 2019-01-24 DIAGNOSIS — M719 Bursopathy, unspecified: Secondary | ICD-10-CM | POA: Insufficient documentation

## 2019-01-24 DIAGNOSIS — Z791 Long term (current) use of non-steroidal anti-inflammatories (NSAID): Secondary | ICD-10-CM | POA: Insufficient documentation

## 2019-01-24 DIAGNOSIS — I10 Essential (primary) hypertension: Secondary | ICD-10-CM | POA: Diagnosis not present

## 2019-01-24 DIAGNOSIS — Z7982 Long term (current) use of aspirin: Secondary | ICD-10-CM | POA: Insufficient documentation

## 2019-01-24 DIAGNOSIS — I8390 Asymptomatic varicose veins of unspecified lower extremity: Secondary | ICD-10-CM | POA: Diagnosis not present

## 2019-01-24 DIAGNOSIS — M797 Fibromyalgia: Secondary | ICD-10-CM | POA: Diagnosis not present

## 2019-01-24 DIAGNOSIS — Z96642 Presence of left artificial hip joint: Secondary | ICD-10-CM | POA: Diagnosis not present

## 2019-01-24 HISTORY — PX: TOTAL HIP ARTHROPLASTY: SHX124

## 2019-01-24 LAB — TYPE AND SCREEN
ABO/RH(D): O POS
Antibody Screen: NEGATIVE

## 2019-01-24 SURGERY — ARTHROPLASTY, HIP, TOTAL, ANTERIOR APPROACH
Anesthesia: Monitor Anesthesia Care | Site: Hip | Laterality: Left

## 2019-01-24 MED ORDER — PHENYLEPHRINE HCL 10 MG/ML IJ SOLN
INTRAMUSCULAR | Status: AC
  Filled 2019-01-24: qty 1

## 2019-01-24 MED ORDER — SODIUM CHLORIDE 0.9 % IV SOLN
INTRAVENOUS | Status: DC | PRN
  Administered 2019-01-24: 25 ug/min via INTRAVENOUS

## 2019-01-24 MED ORDER — PROPOFOL 500 MG/50ML IV EMUL
INTRAVENOUS | Status: DC | PRN
  Administered 2019-01-24 (×2): 15 mg via INTRAVENOUS

## 2019-01-24 MED ORDER — VANCOMYCIN HCL IN DEXTROSE 1-5 GM/200ML-% IV SOLN
1000.0000 mg | Freq: Two times a day (BID) | INTRAVENOUS | Status: AC
  Administered 2019-01-24: 1000 mg via INTRAVENOUS
  Filled 2019-01-24: qty 200

## 2019-01-24 MED ORDER — HYDROCODONE-ACETAMINOPHEN 7.5-325 MG PO TABS
1.0000 | ORAL_TABLET | ORAL | Status: DC | PRN
  Administered 2019-01-24 – 2019-01-25 (×5): 1 via ORAL
  Filled 2019-01-24 (×5): qty 1

## 2019-01-24 MED ORDER — METHOCARBAMOL 500 MG PO TABS
500.0000 mg | ORAL_TABLET | Freq: Four times a day (QID) | ORAL | Status: DC | PRN
  Administered 2019-01-24: 500 mg via ORAL
  Filled 2019-01-24 (×2): qty 1

## 2019-01-24 MED ORDER — VANCOMYCIN HCL IN DEXTROSE 1-5 GM/200ML-% IV SOLN
1000.0000 mg | INTRAVENOUS | Status: AC
  Administered 2019-01-24: 1000 mg via INTRAVENOUS
  Filled 2019-01-24: qty 200

## 2019-01-24 MED ORDER — PHENOL 1.4 % MT LIQD: 1.0000 | OROMUCOSAL | Status: DC | PRN

## 2019-01-24 MED ORDER — ONDANSETRON HCL 4 MG PO TABS: 4.0000 mg | ORAL_TABLET | Freq: Four times a day (QID) | ORAL | Status: DC | PRN

## 2019-01-24 MED ORDER — BUPIVACAINE IN DEXTROSE 0.75-8.25 % IT SOLN
INTRATHECAL | Status: DC | PRN
  Administered 2019-01-24: 1.6 mL via INTRATHECAL

## 2019-01-24 MED ORDER — ASPIRIN EC 325 MG PO TBEC
325.0000 mg | DELAYED_RELEASE_TABLET | Freq: Two times a day (BID) | ORAL | Status: DC
  Administered 2019-01-25: 325 mg via ORAL
  Filled 2019-01-24: qty 1

## 2019-01-24 MED ORDER — PROPOFOL 500 MG/50ML IV EMUL
INTRAVENOUS | Status: DC | PRN
  Administered 2019-01-24: 15 ug/kg/min via INTRAVENOUS

## 2019-01-24 MED ORDER — BISACODYL 5 MG PO TBEC: 5.0000 mg | DELAYED_RELEASE_TABLET | Freq: Every day | ORAL | Status: DC | PRN

## 2019-01-24 MED ORDER — STERILE WATER FOR IRRIGATION IR SOLN
Status: DC | PRN
  Administered 2019-01-24: 2000 mL

## 2019-01-24 MED ORDER — ALUM & MAG HYDROXIDE-SIMETH 200-200-20 MG/5ML PO SUSP: 30.0000 mL | ORAL | Status: DC | PRN

## 2019-01-24 MED ORDER — LACTATED RINGERS IV SOLN
INTRAVENOUS | Status: DC
  Administered 2019-01-24 (×2): via INTRAVENOUS

## 2019-01-24 MED ORDER — FENTANYL CITRATE (PF) 100 MCG/2ML IJ SOLN
25.0000 ug | INTRAMUSCULAR | Status: DC | PRN
  Administered 2019-01-24 (×3): 50 ug via INTRAVENOUS

## 2019-01-24 MED ORDER — OXYCODONE HCL 5 MG PO TABS: 5.0000 mg | ORAL_TABLET | Freq: Once | ORAL | Status: DC | PRN

## 2019-01-24 MED ORDER — MENTHOL 3 MG MT LOZG: 1.0000 | LOZENGE | OROMUCOSAL | Status: DC | PRN

## 2019-01-24 MED ORDER — LACTATED RINGERS IV SOLN
INTRAVENOUS | Status: DC
  Administered 2019-01-24: 17:00:00 via INTRAVENOUS

## 2019-01-24 MED ORDER — CHLORHEXIDINE GLUCONATE 4 % EX LIQD: 60.0000 mL | Freq: Once | CUTANEOUS | Status: DC

## 2019-01-24 MED ORDER — ONDANSETRON HCL 4 MG/2ML IJ SOLN: 4.0000 mg | Freq: Once | INTRAMUSCULAR | Status: DC | PRN

## 2019-01-24 MED ORDER — BUPIVACAINE LIPOSOME 1.3 % IJ SUSP
INTRAMUSCULAR | Status: DC | PRN
  Administered 2019-01-24: 20 mL

## 2019-01-24 MED ORDER — POTASSIUM CHLORIDE CRYS ER 10 MEQ PO TBCR
10.0000 meq | EXTENDED_RELEASE_TABLET | Freq: Every day | ORAL | Status: DC
  Administered 2019-01-24: 10 meq via ORAL
  Filled 2019-01-24: qty 1

## 2019-01-24 MED ORDER — METHOCARBAMOL 500 MG IVPB - SIMPLE MED
500.0000 mg | Freq: Four times a day (QID) | INTRAVENOUS | Status: DC | PRN
  Administered 2019-01-24: 500 mg via INTRAVENOUS
  Filled 2019-01-24: qty 50

## 2019-01-24 MED ORDER — LOSARTAN POTASSIUM 50 MG PO TABS
50.0000 mg | ORAL_TABLET | Freq: Every day | ORAL | Status: DC
  Filled 2019-01-24: qty 1

## 2019-01-24 MED ORDER — FENTANYL CITRATE (PF) 100 MCG/2ML IJ SOLN
INTRAMUSCULAR | Status: AC
  Filled 2019-01-24: qty 2

## 2019-01-24 MED ORDER — ONDANSETRON HCL 4 MG/2ML IJ SOLN: 4.0000 mg | Freq: Four times a day (QID) | INTRAMUSCULAR | Status: DC | PRN

## 2019-01-24 MED ORDER — MORPHINE SULFATE (PF) 2 MG/ML IV SOLN: 0.5000 mg | INTRAVENOUS | Status: DC | PRN

## 2019-01-24 MED ORDER — METOCLOPRAMIDE HCL 5 MG PO TABS: 5.0000 mg | ORAL_TABLET | Freq: Three times a day (TID) | ORAL | Status: DC | PRN

## 2019-01-24 MED ORDER — HYDROCHLOROTHIAZIDE 12.5 MG PO CAPS
12.5000 mg | ORAL_CAPSULE | Freq: Every day | ORAL | Status: DC
  Filled 2019-01-24: qty 1

## 2019-01-24 MED ORDER — HYDROCODONE-ACETAMINOPHEN 5-325 MG PO TABS: 1.0000 | ORAL_TABLET | ORAL | Status: DC | PRN

## 2019-01-24 MED ORDER — ONDANSETRON HCL 4 MG/2ML IJ SOLN
INTRAMUSCULAR | Status: AC
  Filled 2019-01-24: qty 2

## 2019-01-24 MED ORDER — OXYCODONE HCL 5 MG/5ML PO SOLN: 5.0000 mg | Freq: Once | ORAL | Status: DC | PRN

## 2019-01-24 MED ORDER — TRANEXAMIC ACID-NACL 1000-0.7 MG/100ML-% IV SOLN
1000.0000 mg | Freq: Once | INTRAVENOUS | Status: AC
  Administered 2019-01-24: 1000 mg via INTRAVENOUS
  Filled 2019-01-24: qty 100

## 2019-01-24 MED ORDER — PROPOFOL 10 MG/ML IV BOLUS
INTRAVENOUS | Status: AC
  Filled 2019-01-24: qty 20

## 2019-01-24 MED ORDER — TRANEXAMIC ACID 1000 MG/10ML IV SOLN
INTRAVENOUS | Status: DC | PRN
  Administered 2019-01-24: 2000 mg via TOPICAL

## 2019-01-24 MED ORDER — BUPIVACAINE HCL (PF) 0.25 % IJ SOLN
INTRAMUSCULAR | Status: DC | PRN
  Administered 2019-01-24: 30 mL

## 2019-01-24 MED ORDER — CEFAZOLIN SODIUM-DEXTROSE 2-4 GM/100ML-% IV SOLN: 2.0000 g | INTRAVENOUS | Status: DC

## 2019-01-24 MED ORDER — SODIUM CHLORIDE 0.9 % IV BOLUS
500.0000 mL | Freq: Once | INTRAVENOUS | Status: AC
  Administered 2019-01-24: 500 mL via INTRAVENOUS

## 2019-01-24 MED ORDER — BUPIVACAINE HCL (PF) 0.25 % IJ SOLN
INTRAMUSCULAR | Status: AC
  Filled 2019-01-24: qty 30

## 2019-01-24 MED ORDER — METHOCARBAMOL 500 MG IVPB - SIMPLE MED
INTRAVENOUS | Status: AC
  Filled 2019-01-24: qty 50

## 2019-01-24 MED ORDER — DIPHENHYDRAMINE HCL 12.5 MG/5ML PO ELIX: 12.5000 mg | ORAL_SOLUTION | ORAL | Status: DC | PRN

## 2019-01-24 MED ORDER — 0.9 % SODIUM CHLORIDE (POUR BTL) OPTIME
TOPICAL | Status: DC | PRN
  Administered 2019-01-24: 1000 mL

## 2019-01-24 MED ORDER — ACETAMINOPHEN 325 MG PO TABS: 325.0000 mg | ORAL_TABLET | Freq: Four times a day (QID) | ORAL | Status: DC | PRN

## 2019-01-24 MED ORDER — DOCUSATE SODIUM 100 MG PO CAPS
100.0000 mg | ORAL_CAPSULE | Freq: Two times a day (BID) | ORAL | Status: DC
  Administered 2019-01-25: 100 mg via ORAL
  Filled 2019-01-24 (×2): qty 1

## 2019-01-24 MED ORDER — TRANEXAMIC ACID-NACL 1000-0.7 MG/100ML-% IV SOLN
1000.0000 mg | INTRAVENOUS | Status: AC
Start: 2019-01-24 — End: 2019-01-24
  Administered 2019-01-24: 1000 mg via INTRAVENOUS
  Filled 2019-01-24: qty 100

## 2019-01-24 MED ORDER — ACETAMINOPHEN 500 MG PO TABS
500.0000 mg | ORAL_TABLET | Freq: Four times a day (QID) | ORAL | Status: DC
  Administered 2019-01-24 – 2019-01-25 (×3): 500 mg via ORAL
  Filled 2019-01-24 (×4): qty 1

## 2019-01-24 MED ORDER — LOSARTAN POTASSIUM-HCTZ 50-12.5 MG PO TABS: 1.0000 | ORAL_TABLET | Freq: Every day | ORAL | Status: DC

## 2019-01-24 MED ORDER — METOCLOPRAMIDE HCL 5 MG/ML IJ SOLN: 5.0000 mg | Freq: Three times a day (TID) | INTRAMUSCULAR | Status: DC | PRN

## 2019-01-24 MED ORDER — DULOXETINE HCL 60 MG PO CPEP
60.0000 mg | ORAL_CAPSULE | Freq: Every day | ORAL | Status: DC
  Administered 2019-01-24: 60 mg via ORAL
  Filled 2019-01-24: qty 1

## 2019-01-24 MED ORDER — PROPOFOL 10 MG/ML IV BOLUS
INTRAVENOUS | Status: AC
  Filled 2019-01-24: qty 40

## 2019-01-24 MED ORDER — KETOROLAC TROMETHAMINE 15 MG/ML IJ SOLN
7.5000 mg | Freq: Four times a day (QID) | INTRAMUSCULAR | Status: AC
  Administered 2019-01-24 – 2019-01-25 (×4): 7.5 mg via INTRAVENOUS
  Filled 2019-01-24 (×4): qty 1

## 2019-01-24 MED ORDER — ATORVASTATIN CALCIUM 10 MG PO TABS
10.0000 mg | ORAL_TABLET | Freq: Every day | ORAL | Status: DC
  Administered 2019-01-24: 10 mg via ORAL
  Filled 2019-01-24: qty 1

## 2019-01-24 MED ORDER — ONDANSETRON HCL 4 MG/2ML IJ SOLN
INTRAMUSCULAR | Status: DC | PRN
  Administered 2019-01-24: 4 mg via INTRAVENOUS

## 2019-01-24 SURGICAL SUPPLY — 40 items
BLADE SAW SGTL 18X1.27X75 (BLADE) ×2 IMPLANT
BLADE SURG SZ10 CARB STEEL (BLADE) ×4 IMPLANT
CELLS DAT CNTRL 66122 CELL SVR (MISCELLANEOUS) ×1 IMPLANT
COVER PERINEAL POST (MISCELLANEOUS) ×2 IMPLANT
COVER SURGICAL LIGHT HANDLE (MISCELLANEOUS) ×2 IMPLANT
COVER WAND RF STERILE (DRAPES) IMPLANT
CUP GRIPTON 48MM 100 HIP (Hips) ×2 IMPLANT
DRAPE STERI IOBAN 125X83 (DRAPES) ×2 IMPLANT
DRAPE U-SHAPE 47X51 STRL (DRAPES) ×6 IMPLANT
DRSG AQUACEL AG ADV 3.5X10 (GAUZE/BANDAGES/DRESSINGS) ×2 IMPLANT
DRSG OPSITE POSTOP 4X6 (GAUZE/BANDAGES/DRESSINGS) ×2 IMPLANT
DURAPREP 26ML APPLICATOR (WOUND CARE) ×2 IMPLANT
ELECT REM PT RETURN 15FT ADLT (MISCELLANEOUS) ×2 IMPLANT
ELIMINATOR HOLE APEX DEPUY (Hips) ×2 IMPLANT
GLOVE BIO SURGEON STRL SZ8 (GLOVE) ×4 IMPLANT
GLOVE BIOGEL PI IND STRL 8 (GLOVE) ×2 IMPLANT
GLOVE BIOGEL PI INDICATOR 8 (GLOVE) ×2
GOWN STRL REUS W/ TWL LRG LVL3 (GOWN DISPOSABLE) ×2 IMPLANT
GOWN STRL REUS W/ TWL XL LVL3 (GOWN DISPOSABLE) ×2 IMPLANT
GOWN STRL REUS W/TWL LRG LVL3 (GOWN DISPOSABLE) ×2
GOWN STRL REUS W/TWL XL LVL3 (GOWN DISPOSABLE) ×2
HEAD FEMORAL 32 CERAMIC (Hips) ×2 IMPLANT
LINER ACET 32X48 (Liner) ×2 IMPLANT
MANIFOLD NEPTUNE II (INSTRUMENTS) ×2 IMPLANT
NEEDLE HYPO 22GX1.5 SAFETY (NEEDLE) ×2 IMPLANT
NS IRRIG 1000ML POUR BTL (IV SOLUTION) ×2 IMPLANT
PACK ANTERIOR HIP CUSTOM (KITS) ×2 IMPLANT
PAD ARMBOARD 7.5X6 YLW CONV (MISCELLANEOUS) ×2 IMPLANT
PROTECTOR NERVE ULNAR (MISCELLANEOUS) ×2 IMPLANT
RTRCTR WOUND ALEXIS 18CM MED (MISCELLANEOUS) ×2
STEM FEM ACTIS STD SZ4 (Stem) ×2 IMPLANT
SUT ETHIBOND NAB CT1 #1 30IN (SUTURE) ×4 IMPLANT
SUT VIC AB 1 CT1 36 (SUTURE) ×2 IMPLANT
SUT VIC AB 2-0 CT1 27 (SUTURE) ×1
SUT VIC AB 2-0 CT1 TAPERPNT 27 (SUTURE) ×1 IMPLANT
SUT VIC AB 3-0 PS2 18 (SUTURE) ×1
SUT VIC AB 3-0 PS2 18XBRD (SUTURE) ×1 IMPLANT
SUT VLOC 180 0 24IN GS25 (SUTURE) ×2 IMPLANT
SYR 50ML LL SCALE MARK (SYRINGE) ×2 IMPLANT
YANKAUER SUCT BULB TIP 10FT TU (MISCELLANEOUS) ×2 IMPLANT

## 2019-01-24 NOTE — Anesthesia Procedure Notes (Signed)
Spinal  Patient location during procedure: OR Start time: 01/24/2019 11:55 AM End time: 01/24/2019 11:59 AM Staffing Anesthesiologist: Roberts Gaudy, MD Resident/CRNA: Glory Buff, CRNA Performed: resident/CRNA  Preanesthetic Checklist Completed: patient identified, site marked, surgical consent, pre-op evaluation, timeout performed, IV checked, risks and benefits discussed and monitors and equipment checked Spinal Block Patient position: sitting Prep: DuraPrep Patient monitoring: heart rate, continuous pulse ox and blood pressure Approach: midline Location: L3-4 Injection technique: single-shot Needle Needle type: Pencan  Needle gauge: 24 G Needle length: 9 cm Needle insertion depth: 6 cm Assessment Sensory level: T6 Additional Notes Kit expiration date checked and padded.  Sterile prep and drape, skin local with 1% lidocaine, stick x 1, - heme, - paraesthesia, + CSF pre and post injection, patient tolerated procedure well.

## 2019-01-24 NOTE — Interval H&P Note (Signed)
History and Physical Interval Note:  01/24/2019 11:38 AM  Julia Boone  has presented today for surgery, with the diagnosis of Left Hip Degenerative Joint Disease  The various methods of treatment have been discussed with the patient and family. After consideration of risks, benefits and other options for treatment, the patient has consented to  Procedure(s): TOTAL HIP ARTHROPLASTY ANTERIOR APPROACH (Left) as a surgical intervention .  The patient's history has been reviewed, patient examined, no change in status, stable for surgery.  I have reviewed the patient's chart and labs.  Questions were answered to the patient's satisfaction.     Hessie Dibble

## 2019-01-24 NOTE — Transfer of Care (Signed)
Immediate Anesthesia Transfer of Care Note  Patient: Julia Boone  Procedure(s) Performed: TOTAL HIP ARTHROPLASTY ANTERIOR APPROACH (Left Hip)  Patient Location: PACU  Anesthesia Type:MAC and Spinal  Level of Consciousness: awake, alert  and oriented  Airway & Oxygen Therapy: Patient Spontanous Breathing and Patient connected to face mask oxygen  Post-op Assessment: Report given to RN and Post -op Vital signs reviewed and stable  Post vital signs: Reviewed and stable  Last Vitals:  Vitals Value Taken Time  BP 117/87 01/24/2019  1:31 PM  Temp    Pulse 59 01/24/2019  1:34 PM  Resp 11 01/24/2019  1:34 PM  SpO2 100 % 01/24/2019  1:34 PM  Vitals shown include unvalidated device data.  Last Pain:  Vitals:   01/24/19 1111  TempSrc:   PainSc: 4       Patients Stated Pain Goal: 3 (80/06/34 9494)  Complications: No apparent anesthesia complications

## 2019-01-24 NOTE — Progress Notes (Signed)
PT Cancellation Note  Patient Details Name: Julia Boone MRN: [id number] DOB: 1945/12/09   Cancelled Treatment:    Reason Eval/Treat Not Completed: Pain limiting ability to participate, will initiate in Am.   Tresa Endo [place] Pager [phone] Office [phone]  01/24/2019, 5:45 PM

## 2019-01-24 NOTE — Anesthesia Preprocedure Evaluation (Signed)
Anesthesia Evaluation  Patient identified by MRN, date of birth, ID band Patient awake    Reviewed: Allergy & Precautions, NPO status , Patient's Chart, lab work & pertinent test results  Airway Mallampati: II  TM Distance: >3 FB Neck ROM: Full    Dental  (+) Teeth Intact, Dental Advisory Given   Pulmonary former smoker,    breath sounds clear to auscultation       Cardiovascular hypertension,  Rhythm:Regular Rate:Normal     Neuro/Psych    GI/Hepatic   Endo/Other    Renal/GU      Musculoskeletal   Abdominal   Peds  Hematology   Anesthesia Other Findings   Reproductive/Obstetrics                             Anesthesia Physical Anesthesia Plan  ASA: II  Anesthesia Plan: MAC and Spinal   Post-op Pain Management:    Induction: Intravenous  PONV Risk Score and Plan: Ondansetron and Dexamethasone  Airway Management Planned: Simple Face Mask and Natural Airway  Additional Equipment:   Intra-op Plan:   Post-operative Plan:   Informed Consent: I have reviewed the patients History and Physical, chart, labs and discussed the procedure including the risks, benefits and alternatives for the proposed anesthesia with the patient or authorized representative who has indicated his/her understanding and acceptance.       Plan Discussed with: Anesthesiologist  Anesthesia Plan Comments:         Anesthesia Quick Evaluation

## 2019-01-24 NOTE — Anesthesia Procedure Notes (Signed)
Date/Time: 01/24/2019 11:50 AM Performed by: Glory Buff, CRNA Oxygen Delivery Method: Simple face mask

## 2019-01-24 NOTE — Progress Notes (Signed)
Per Dr. Rhona Raider, pt needs TED hose-knee high for surgery.

## 2019-01-24 NOTE — Care Plan (Signed)
Ortho Bundle Case Management Note  Patient Details  Name: MIGUEL MEDAL MRN: [id number] Date of Birth: [birth year]  Spoke with patient prior to surgery. She will discharge to home with family and HHPT. She has an OPPT appointment in place if further therapy is needed. Rolling walker has been ordered. Patient is aware and agreeable. Choice offered.                    DME Arranged:  Gilford Rile rolling DME Agency:  [employer]  HH Arranged:  PT [place] Agency:  [facility] ([facility])  Additional Comments: Please contact me with any questions of if this plan should need to change.  Ladell Heads,  [place] [facility]ist  [phone] 01/24/2019, 10:29 AM

## 2019-01-24 NOTE — Progress Notes (Signed)
Upon arrival to unit 3w from PACU, pt'd bp 80/46 automatic, 75/46 manual.  Loni Dolly, PA made aware.  Order for 531ml bolus received and infusing. Pt complains of dizziness.  RN will monitor.

## 2019-01-24 NOTE — Progress Notes (Signed)
CSW consult- SNF   Plan: Spoke with patient prior to surgery. She will discharge to home with family and HHPT. She has an OPPT appointment in place if further therapy is needed. Rolling walker has been ordered.   CSW will sign off.   Kathrin Greathouse, Marlinda Mike, MSW Clinical Social Worker  [phone] 01/24/2019  4:08 PM

## 2019-01-24 NOTE — Op Note (Signed)
PRE-OP DIAGNOSIS:  LEFT HIP DEGENERATIVE JOINT DISEASE POST-OP DIAGNOSIS: same PROCEDURE:  LEFT TOTAL HIP ARTHROPLASTY ANTERIOR APPROACH ANESTHESIA:  Spinal and MAC SURGEON:  Melrose Nakayama MD ASSISTANT:  Loni Dolly PA-C   INDICATIONS FOR PROCEDURE:  The patient is a 73 y.o. female with a long history of a painful hip.  This has persisted despite multiple conservative measures.  The patient has persisted with pain and dysfunction making rest and activity difficult.  A total hip replacement is offered as surgical treatment.  Informed operative consent was obtained after discussion of possible complications including reaction to anesthesia, infection, neurovascular injury, dislocation, DVT, PE, and death.  The importance of the postoperative rehab program to optimize result was stressed with the patient.  SUMMARY OF FINDINGS AND PROCEDURE:  Under the above anesthesia through a anterior approach an the Hana table a left THR was performed.  The patient had severe degenerative change and good bone quality.  We used DePuy components to replace the hip and these were size 4 standard Actis femur capped with a +1 32 ceramic hip ball.  On the acetabular side we used a size 48 Gription shell with a plus 0 neutral polyethylene liner.  We did use a hole eliminator.  Loni Dolly PA-C assisted throughout and was invaluable to the completion of the case in that he helped position and retract while I performed the procedure.  He also closed simultaneously to help minimize OR time.  I used fluoroscopy throughout the case to check position of components and leg lengths and read all these views myself.  DESCRIPTION OF PROCEDURE:  The patient was taken to the OR suite where the above anesthetic was applied.  The patient was then positioned on the Hana table supine.  All bony prominences were appropriately padded.  Prep and drape was then performed in normal sterile fashion.  The patient was given vancomycin preoperative  antibiotic and an appropriate time out was performed.  We then took an anterior approach to the left hip.  Dissection was taken through adipose to the tensor fascia lata fascia.  This structure was incised longitudinally and we dissected in the intermuscular interval just medial to this muscle.  Cobra retractors were placed superior and inferior to the femoral neck superficial to the capsule.  A capsular incision was then made and the retractors were placed along the femoral neck.  Xray was brought in to get a good level for the femoral neck cut which was made with an oscillating saw and osteotome.  The femoral head was removed with a corkscrew.  The acetabulum was exposed and some labral tissues were excised. Reaming was taken to the inside wall of the pelvis and sequentially up to 1 mm smaller than the actual component.  A trial of components was done and then the aforementioned acetabular shell was placed in appropriate tilt and anteversion confirmed by fluoroscopy. The liner was placed along with the hole eliminator and attention was turned to the femur.  The leg was brought down and over into adduction and the elevator bar was used to raise the femur up gently in the wound.  The piriformis was released with care taken to preserve the obturator internus attachment and all of the posterior capsule. The femur was reamed and then broached to the appropriate size.  A trial reduction was done and the aforementioned head and neck assembly gave [place] the best stability in extension with external rotation.  Leg lengths were felt to be about equal by  fluoroscopic exam.  The trial components were removed and the wound irrigated.  We then placed the femoral component in appropriate anteversion.  The head was applied to a dry stem neck and the hip again reduced.  It was again stable in the aforementioned position.  The would was irrigated again followed by re-approximation of anterior capsule with ethibond suture. Tensor  fascia was repaired with V-loc suture  followed by deep closure with #O and #2 undyed vicryl.  Skin was closed with subQ stitch and steristrips followed by a sterile dressing.  EBL and IOF can be obtained from anesthesia records.  DISPOSITION:  The patient was extubated in the OR and taken to PACU in stable condition to be admitted to the Orthopedic Surgery for appropriate post-op care to include perioperative antibiotics and DVT prophylaxis.

## 2019-01-25 ENCOUNTER — Encounter (HOSPITAL_COMMUNITY): Payer: Self-pay | Admitting: Orthopaedic Surgery

## 2019-01-25 DIAGNOSIS — K219 Gastro-esophageal reflux disease without esophagitis: Secondary | ICD-10-CM | POA: Diagnosis not present

## 2019-01-25 DIAGNOSIS — M1612 Unilateral primary osteoarthritis, left hip: Secondary | ICD-10-CM | POA: Diagnosis present

## 2019-01-25 DIAGNOSIS — M797 Fibromyalgia: Secondary | ICD-10-CM | POA: Diagnosis not present

## 2019-01-25 DIAGNOSIS — E785 Hyperlipidemia, unspecified: Secondary | ICD-10-CM | POA: Diagnosis not present

## 2019-01-25 DIAGNOSIS — R011 Cardiac murmur, unspecified: Secondary | ICD-10-CM | POA: Diagnosis not present

## 2019-01-25 DIAGNOSIS — M719 Bursopathy, unspecified: Secondary | ICD-10-CM | POA: Diagnosis not present

## 2019-01-25 MED ORDER — ASPIRIN 325 MG PO TBEC: 325.0000 mg | DELAYED_RELEASE_TABLET | Freq: Two times a day (BID) | ORAL | 0 refills | Status: AC

## 2019-01-25 MED ORDER — HYDROCODONE-ACETAMINOPHEN 5-325 MG PO TABS: 1.0000 | ORAL_TABLET | Freq: Four times a day (QID) | ORAL | 0 refills | Status: AC | PRN

## 2019-01-25 MED ORDER — TIZANIDINE HCL 4 MG PO TABS: 4.0000 mg | ORAL_TABLET | Freq: Four times a day (QID) | ORAL | 1 refills | Status: AC | PRN

## 2019-01-25 NOTE — Care Management CC44 (Signed)
Condition Code 44 Documentation Completed  Patient Details  Name: Julia Boone MRN: [id number] Date of Birth: October 07, 1946   Condition Code 44 given:  Yes Patient signature on Condition Code 44 notice:  Yes Documentation of 2 MD's agreement:  Yes Code 44 added to claim:  Yes    Leeroy Cha, RN 01/25/2019, 3:43 PM

## 2019-01-25 NOTE — Plan of Care (Signed)
  Problem: Education: Goal: Knowledge of General Education information will improve Description Including pain rating scale, medication(s)/side effects and non-pharmacologic comfort measures Outcome: Progressing   Problem: Clinical Measurements: Goal: Will remain free from infection Outcome: Progressing Goal: Respiratory complications will improve Outcome: Progressing   Problem: Nutrition: Goal: Adequate nutrition will be maintained Outcome: Progressing   Problem: Coping: Goal: Level of anxiety will decrease Outcome: Progressing   Problem: Safety: Goal: Ability to remain free from injury will improve Outcome: Progressing

## 2019-01-25 NOTE — Discharge Summary (Signed)
Patient ID: Julia Boone MRN: [id number] DOB/AGE: 72/16/1947 72 y.o.  Admit date: 01/24/2019 Discharge date: 01/25/2019  Admission Diagnoses:  Principal Problem:   Primary localized osteoarthritis of left hip   Discharge Diagnoses:  Same  Past Medical History:  Diagnosis Date  . Arthritis   . Bursitis    left hip,Gioffre  . DJD (degenerative joint disease)   . Fibromyalgia   . GERD (gastroesophageal reflux disease)   . H/O gastroesophageal reflux (GERD)   . Heart murmur   . Hx of cystoscopy    negative  . Hyperlipidemia   . Hypertension   . Microhematuria    Nesi, CT right renal cyst,  . Osteoporosis   . Polyp of colon, hyperplastic   . Varicose veins     Surgeries: Procedure(s): TOTAL HIP ARTHROPLASTY ANTERIOR APPROACH on 01/24/2019   Consultants:   Discharged Condition: Improved  Hospital Course: Julia Boone is an 73 y.o. female who was admitted 01/24/2019 for operative treatment ofPrimary localized osteoarthritis of left hip. Patient has severe unremitting pain that affects sleep, daily activities, and work/hobbies. After pre-op clearance the patient was taken to the operating room on 01/24/2019 and underwent  Procedure(s): TOTAL HIP ARTHROPLASTY ANTERIOR APPROACH.    Patient was given perioperative antibiotics:  Anti-infectives (From admission, onward)   Start     Dose/Rate Route Frequency Ordered Stop   01/24/19 2330  vancomycin (VANCOCIN) IVPB 1000 mg/200 mL premix     1,000 mg 200 mL/hr over 60 Minutes Intravenous Every 12 hours 01/24/19 1513 01/24/19 2340   01/24/19 1115  vancomycin (VANCOCIN) IVPB 1000 mg/200 mL premix     1,000 mg 200 mL/hr over 60 Minutes Intravenous On call to O.R. 01/24/19 1101 01/24/19 1231   01/24/19 1045  ceFAZolin (ANCEF) IVPB 2g/100 mL premix  Status:  Discontinued     2 g 200 mL/hr over 30 Minutes Intravenous On call to O.R. 01/24/19 1031 01/24/19 1102       Patient was given sequential compression devices,  early ambulation, and chemoprophylaxis to prevent DVT.  Patient benefited maximally from hospital stay and there were no complications.    Recent vital signs:  Patient Vitals for the past 24 hrs:  BP Temp Temp src Pulse Resp SpO2  01/25/19 1351 91/63 - - 98 - 94 %  01/25/19 1334 (!) 123/45 98.1 F (36.7 C) Oral (!) 101 17 91 %  01/25/19 1138 (!) 105/50 - - 86 18 95 %  01/25/19 0948 (!) 107/46 97.9 F (36.6 C) Oral 99 16 91 %  01/25/19 0927 95/60 - - - - -  01/25/19 0613 (!) 97/53 98.1 F (36.7 C) Oral 99 16 96 %  01/25/19 0157 (!) 90/52 98.2 F (36.8 C) Oral 96 18 94 %  01/24/19 2211 - 97.6 F (36.4 C) Oral 81 18 97 %  01/24/19 2206 (!) 110/58 - - - - -  01/24/19 2030 (!) 98/54 - - - - -  01/24/19 1755 (!) 103/59 98.4 F (36.9 C) - 97 16 97 %  01/24/19 1719 (!) 90/36 98.1 F (36.7 C) - 89 16 96 %  01/24/19 1645 (!) 94/52 - - 82 - -  01/24/19 1610 (!) 98/50 - - 75 - -  01/24/19 1529 (!) 75/46 - - - - -  01/24/19 1519 (!) 80/46 (!) 97.4 F (36.3 C) Oral 69 18 100 %  01/24/19 1500 - - - 69 13 95 %  01/24/19 1445 (!) 107/57 98.3 F (36.8 C) - 77  17 97 %  01/24/19 1430 (!) 116/58 - - 72 14 98 %     Recent laboratory studies: No results for input(s): WBC, HGB, HCT, PLT, NA, K, CL, CO2, BUN, CREATININE, GLUCOSE, INR, CALCIUM in the last 72 hours.  Invalid input(s): PT, 2   Discharge Medications:   Allergies as of 01/25/2019      Reactions   Penicillins Anaphylaxis, Hives   Did it involve swelling of the face/tongue/throat, SOB, or low BP? Yes Did it involve sudden or severe rash/hives, skin peeling, or any reaction on the inside of your mouth or nose? Yes Did you need to seek medical attention at a hospital or doctor's office? Unknown When did it last happen?Childhood allergy If all above answers are "NO", may proceed with cephalosporin use.   Lisinopril Cough   Penicillins    Rash /swelling      Medication List    STOP taking these medications   aspirin 81 MG  tablet Replaced by:  aspirin 325 MG EC tablet   ibuprofen 200 MG tablet Commonly known as:  ADVIL,MOTRIN     TAKE these medications   acetaminophen 500 MG tablet Commonly known as:  TYLENOL Take 1,000 mg by mouth 2 (two) times daily as needed for moderate pain.   alendronate 70 MG tablet Commonly known as:  FOSAMAX Take 70 mg by mouth once a week. Take with a full glass of water on an empty stomach.   aspirin 325 MG EC tablet Take 1 tablet (325 mg total) by mouth 2 (two) times daily after a meal. Replaces:  aspirin 81 MG tablet   atorvastatin 10 MG tablet Commonly known as:  LIPITOR Take 10 mg by mouth at bedtime.   atorvastatin 10 MG tablet Commonly known as:  LIPITOR Take 10 mg by mouth daily.   BLINK TEARS 0.25 % Soln Generic drug:  Polyethylene Glycol 400 Place 1 drop into both eyes daily as needed (dry eyes).   CALCIUM + D PO Take 1 tablet by mouth at bedtime.   cyclobenzaprine 10 MG tablet Commonly known as:  FLEXERIL Take 10 mg by mouth 3 (three) times daily as needed for muscle spasms.   DULoxetine 60 MG capsule Commonly known as:  CYMBALTA Take 60 mg by mouth at bedtime.   DULoxetine 60 MG capsule Commonly known as:  CYMBALTA Take 60 mg by mouth daily.   hydrochlorothiazide 12.5 MG capsule Commonly known as:  MICROZIDE Take 12.5 mg by mouth at bedtime.   HYDROcodone-acetaminophen 5-325 MG tablet Commonly known as:  NORCO/VICODIN Take 1-2 tablets by mouth every 6 (six) hours as needed for moderate pain (pain score 4-6).   losartan 50 MG tablet Commonly known as:  COZAAR Take 50 mg by mouth at bedtime.   losartan-hydrochlorothiazide 50-12.5 MG tablet Commonly known as:  HYZAAR Take 1 tablet by mouth daily.   multivitamin with minerals Tabs tablet Take 1 tablet by mouth at bedtime.   omeprazole 20 MG capsule Commonly known as:  PRILOSEC Take 20 mg by mouth daily as needed (acid reflux).   omeprazole 20 MG capsule Commonly known as:   PRILOSEC Take 20 mg by mouth daily.   potassium chloride 10 MEQ tablet Commonly known as:  K-DUR,KLOR-CON Take 10 mEq by mouth at bedtime.   tiZANidine 4 MG tablet Commonly known as:  ZANAFLEX Take 1 tablet (4 mg total) by mouth every 6 (six) hours as needed for muscle spasms.  Durable Medical Equipment  (From admission, onward)         Start     Ordered   01/24/19 1514  DME Walker rolling  Once    Question:  Patient needs a walker to treat with the following condition  Answer:  Primary osteoarthritis of left hip   01/24/19 1513   01/24/19 1514  DME 3 n 1  Once     01/24/19 1513   01/24/19 1514  DME Bedside commode  Once    Question:  Patient needs a bedside commode to treat with the following condition  Answer:  Primary osteoarthritis of left hip   01/24/19 1513          Diagnostic Studies: Dg Chest 2 View  Result Date: 01/16/2019 CLINICAL DATA:  Preop hip replacement surgery heart murmur former smoker EXAM: CHEST - 2 VIEW COMPARISON:  None. FINDINGS: Mild bronchitic changes at the bases. No acute consolidation or effusion. Normal heart size. Aortic atherosclerosis. No pneumothorax. IMPRESSION: No active cardiopulmonary disease. Mild bronchitic changes at the lung bases. Electronically Signed   By: Donavan Foil M.D.   On: 01/16/2019 16:28   Dg C-arm 1-60 Min-no Report  Result Date: 01/24/2019 Fluoroscopy was utilized by the requesting physician.  No radiographic interpretation.   Dg Hip Operative Unilat W Or W/o Pelvis Left  Result Date: 01/24/2019 CLINICAL DATA:  73 year old female for left hip replacement. Initial encounter. EXAM: OPERATIVE left HIP (WITH PELVIS IF PERFORMED) 4 VIEWS TECHNIQUE: Fluoroscopic spot image(s) were submitted for interpretation post-operatively. Fluoroscopic time: 15 seconds. COMPARISON:  None. FINDINGS: Four intraoperative C-arm views submitted for review after surgery. Post total left hip replacement which appears in satisfactory  position without complication noted on frontal imaging. IMPRESSION: Post left hip replacement. Electronically Signed   By: Genia Del M.D.   On: 01/24/2019 13:38    Disposition: Discharge disposition: 01-Home or Self Care       Discharge Instructions    Call MD / Call 911   Complete by:  As directed    If you experience chest pain or shortness of breath, CALL 911 and be transported to the hospital emergency room.  If you develope a fever above 101 F, pus (white drainage) or increased drainage or redness at the wound, or calf pain, call your surgeon's office.   Constipation Prevention   Complete by:  As directed    Drink plenty of fluids.  Prune juice may be helpful.  You may use a stool softener, such as Colace (over the counter) 100 mg twice a day.  Use MiraLax (over the counter) for constipation as needed.   Diet - low sodium heart healthy   Complete by:  As directed    Discharge instructions   Complete by:  As directed    INSTRUCTIONS AFTER JOINT REPLACEMENT   Remove items at home which could result in a fall. This includes throw rugs or furniture in walking pathways ICE to the affected joint every three hours while awake for 30 minutes at a time, for at least the first 3-5 days, and then as needed for pain and swelling.  Continue to use ice for pain and swelling. You may notice swelling that will progress down to the foot and ankle.  This is normal after surgery.  Elevate your leg when you are not up walking on it.   Continue to use the breathing machine you got in the hospital (incentive spirometer) which will help keep your temperature down.  It  is common for your temperature to cycle up and down following surgery, especially at night when you are not up moving around and exerting yourself.  The breathing machine keeps your lungs expanded and your temperature down.   DIET:  As you were doing prior to hospitalization, we recommend a well-balanced diet.  DRESSING / WOUND CARE /  SHOWERING  You may shower 3 days after surgery, but keep the wounds dry during showering.  You may use an occlusive plastic wrap (Press'n Seal for example), NO SOAKING/SUBMERGING IN THE BATHTUB.  If the bandage gets wet, change with a clean dry gauze.  If the incision gets wet, pat the wound dry with a clean towel.  ACTIVITY  Increase activity slowly as tolerated, but follow the weight bearing instructions below.   No driving for 6 weeks or until further direction given by your physician.  You cannot drive while taking narcotics.  No lifting or carrying greater than 10 lbs. until further directed by your surgeon. Avoid periods of inactivity such as sitting longer than an hour when not asleep. This helps prevent blood clots.  You may return to work once you are authorized by your doctor.     WEIGHT BEARING   Weight bearing as tolerated with assist device (walker, cane, etc) as directed, use it as long as suggested by your surgeon or therapist, typically at least 4-6 weeks.   EXERCISES  Results after joint replacement surgery are often greatly improved when you follow the exercise, range of motion and muscle strengthening exercises prescribed by your doctor. Safety measures are also important to protect the joint from further injury. Any time any of these exercises cause you to have increased pain or swelling, decrease what you are doing until you are comfortable again and then slowly increase them. If you have problems or questions, call your caregiver or physical therapist for advice.   Rehabilitation is important following a joint replacement. After just a few days of immobilization, the muscles of the leg can become weakened and shrink (atrophy).  These exercises are designed to build up the tone and strength of the thigh and leg muscles and to improve motion. Often times heat used for twenty to thirty minutes before working out will loosen up your tissues and help with improving the range  of motion but do not use heat for the first two weeks following surgery (sometimes heat can increase post-operative swelling).   These exercises can be done on a training (exercise) mat, on the floor, on a table or on a bed. Use whatever works the best and is most comfortable for you.    Use music or television while you are exercising so that the exercises are a pleasant break in your day. This will make your life better with the exercises acting as a break in your routine that you can look forward to.   Perform all exercises about fifteen times, three times per day or as directed.  You should exercise both the operative leg and the other leg as well.   Exercises include:   Quad Sets - Tighten up the muscle on the front of the thigh (Quad) and hold for 5-10 seconds.   Straight Leg Raises - With your knee straight (if you were given a brace, keep it on), lift the leg to 60 degrees, hold for 3 seconds, and slowly lower the leg.  Perform this exercise against resistance later as your leg gets stronger.  Leg Slides: Lying on your back,  slowly slide your foot toward your buttocks, bending your knee up off the floor (only go as far as is comfortable). Then slowly slide your foot back down until your leg is flat on the floor again.  Angel Wings: Lying on your back spread your legs to the side as far apart as you can without causing discomfort.  Hamstring Strength:  Lying on your back, push your heel against the floor with your leg straight by tightening up the muscles of your buttocks.  Repeat, but this time bend your knee to a comfortable angle, and push your heel against the floor.  You may put a pillow under the heel to make it more comfortable if necessary.   A rehabilitation program following joint replacement surgery can speed recovery and prevent re-injury in the future due to weakened muscles. Contact your doctor or a physical therapist for more information on knee rehabilitation.     CONSTIPATION  Constipation is defined medically as fewer than three stools per week and severe constipation as less than one stool per week.  Even if you have a regular bowel pattern at home, your normal regimen is likely to be disrupted due to multiple reasons following surgery.  Combination of anesthesia, postoperative narcotics, change in appetite and fluid intake all can affect your bowels.   YOU MUST use at least one of the following options; they are listed in order of increasing strength to get the job done.  They are all available over the counter, and you may need to use some, POSSIBLY even all of these options:    Drink plenty of fluids (prune juice may be helpful) and high fiber foods Colace 100 mg by mouth twice a day  Senokot for constipation as directed and as needed Dulcolax (bisacodyl), take with full glass of water  Miralax (polyethylene glycol) once or twice a day as needed.  If you have tried all these things and are unable to have a bowel movement in the first 3-4 days after surgery call either your surgeon or your primary doctor.    If you experience loose stools or diarrhea, hold the medications until you stool forms back up.  If your symptoms do not get better within 1 week or if they get worse, check with your doctor.  If you experience "the worst abdominal pain ever" or develop nausea or vomiting, please contact the office immediately for further recommendations for treatment.   ITCHING:  If you experience itching with your medications, try taking only a single pain pill, or even half a pain pill at a time.  You can also use Benadryl over the counter for itching or also to help with sleep.   TED HOSE STOCKINGS:  Use stockings on both legs until for at least 2 weeks or as directed by physician office. They may be removed at night for sleeping.  MEDICATIONS:  See your medication summary on the "After Visit Summary" that nursing will review with you.  You may have some  home medications which will be placed on hold until you complete the course of blood thinner medication.  It is important for you to complete the blood thinner medication as prescribed.  PRECAUTIONS:  If you experience chest pain or shortness of breath - call 911 immediately for transfer to the hospital emergency department.   If you develop a fever greater that 101 F, purulent drainage from wound, increased redness or drainage from wound, foul odor from the wound/dressing, or calf pain - CONTACT  YOUR SURGEON.                                                   FOLLOW-UP APPOINTMENTS:  If you do not already have a post-op appointment, please call the office for an appointment to be seen by your surgeon.  Guidelines for how soon to be seen are listed in your "After Visit Summary", but are typically between 1-4 weeks after surgery.  OTHER INSTRUCTIONS:   Knee Replacement:  Do not place pillow under knee, focus on keeping the knee straight while resting. CPM instructions: 0-90 degrees, 2 hours in the morning, 2 hours in the afternoon, and 2 hours in the evening. Place foam block, curve side up under heel at all times except when in CPM or when walking.  DO NOT modify, tear, cut, or change the foam block in any way.  MAKE SURE YOU:  Understand these instructions.  Get help right away if you are not doing well or get worse.    Thank you for letting us be a part of your medical care team.  It is a privilege we respect greatly.  We hope these instructions will help you stay on track for a fast and full recovery!   Increase activity slowly as tolerated   Complete by:  As directed       Follow-up Information    Melrose Nakayama, MD. Go on 02/06/2019.   Specialty:  Orthopedic Surgery Why:  Your appointment has been scheduled for 930 Contact information: [place] [address] [phone]        [facility] Follow up.   Specialty:  [place] Why:  [facility] will see you  for 5 visits prior to your follow up with Dr. Jonelle Sidle information: [place] [address] [phone]        [facility], [place]. Go on 02/06/2019.   Why:  You are scheduled to start Outpatient physical therapy at 12:00 with [facility]. Please arrive at 11:40 to complete your paperwork  Contact information: Physical Therapy [place] [address] [phone]        Melrose Nakayama, MD. Schedule an appointment as soon as possible for a visit in 2 weeks.   Specialty:  Orthopedic Surgery Contact information: [place] [address] [phone]            Signed: Larwance Sachs Seann Genther 01/25/2019, 2:27 PM

## 2019-01-25 NOTE — Progress Notes (Signed)
   01/25/19 1138 01/25/19 1334 01/25/19 1351  Vitals  Temp  --  98.1 F (36.7 C)  --   Temp Source  --  Oral  --   BP (!) 105/50 (!) 123/45 91/63  MAP (mmHg) 67 66 73  BP Location Left Arm Left Arm  --   BP Method Automatic Automatic Automatic  Patient Position (if appropriate) Sitting Sitting  --   Pulse Rate 86 (!) 101 98  Pulse Rate Source Monitor  --  Monitor   Spoke with PA, he is aware of patients vitals, no acute changes and patient is asymptomatic. I will continue to monitor.

## 2019-01-25 NOTE — Evaluation (Signed)
Physical Therapy Evaluation Patient Details Name: Julia Boone MRN: [id number] DOB: Aug 03, 1946 Today's Date: 01/25/2019   History of Present Illness  Pt s/p L THR  Clinical Impression  Pt s/p L THR and presents with decreased L LE strength/ROM and post op pain limiting functional mobility.  Pt should progress to dc home with family assist.    Follow Up Recommendations Home health PT;Follow surgeon's recommendation for DC plan and follow-up therapies    Equipment Recommendations  None recommended by PT    Recommendations for Other Services       Precautions / Restrictions Precautions Precautions: Fall Restrictions Weight Bearing Restrictions: No Other Position/Activity Restrictions: WBAT      Mobility  Bed Mobility               General bed mobility comments: NT - pt up in chair and requests back to same.  Transfers Overall transfer level: Needs assistance Equipment used: Rolling walker (2 wheeled) Transfers: Sit to/from Stand Sit to Stand: Min assist         General transfer comment: cues for LE management and use of UEs to self assist  Ambulation/Gait Ambulation/Gait assistance: Min assist Gait Distance (Feet): 60 Feet(and 15' into bathroom) Assistive device: Rolling walker (2 wheeled) Gait Pattern/deviations: Step-to pattern;Step-through pattern;Decreased step length - right;Decreased step length - left;Shuffle;Trunk flexed Gait velocity: decr   General Gait Details: cues for posture, position from RW and initial sequence  Stairs            Wheelchair Mobility    Modified Rankin (Stroke Patients Only)       Balance Overall balance assessment: Mild deficits observed, not formally tested                                           Pertinent Vitals/Pain Pain Assessment: 0-10 Pain Score: 8  Pain Location: L hip Pain Descriptors / Indicators: Aching;Sore Pain Intervention(s): Limited activity within patient's  tolerance;Monitored during session;Premedicated before session;Ice applied    Home Living Family/patient expects to be discharged to:: Private residence Living Arrangements: Spouse/significant other Available Help at Discharge: Family Type of Home: House Home Access: Stairs to enter Entrance Stairs-Rails: None Entrance Stairs-Number of Steps: 2 Home Layout: One level Home Equipment: Environmental consultant - 2 wheels;Bedside commode      Prior Function Level of Independence: Independent               Hand Dominance        Extremity/Trunk Assessment   Upper Extremity Assessment Upper Extremity Assessment: Overall WFL for tasks assessed    Lower Extremity Assessment Lower Extremity Assessment: LLE deficits/detail LLE Deficits / Details: strength at hip 2?5 with AAROM at hip to 15 abd and 80 flex    Cervical / Trunk Assessment Cervical / Trunk Assessment: Normal  Communication   Communication: No difficulties  Cognition Arousal/Alertness: Awake/alert Behavior During Therapy: WFL for tasks assessed/performed Overall Cognitive Status: Within Functional Limits for tasks assessed                                        General Comments      Exercises Total Joint Exercises Ankle Circles/Pumps: AROM;Both;15 reps;Supine Quad Sets: AROM;Both;10 reps;Supine Heel Slides: AAROM;Left;20 reps;Supine Hip ABduction/ADduction: AAROM;Right;15 reps;Supine   Assessment/Plan    PT Assessment  Patient needs continued PT services  PT Problem List Decreased strength;Decreased range of motion;Decreased activity tolerance;Decreased mobility;Decreased knowledge of use of DME;Pain       PT Treatment Interventions DME instruction;Gait training;Stair training;Functional mobility training;Therapeutic activities;Therapeutic exercise;Patient/family education    PT Goals (Current goals can be found in the Care Plan section)  Acute Rehab PT Goals Patient Stated Goal: Regain IND PT Goal  Formulation: With patient Time For Goal Achievement: 02/01/19 Potential to Achieve Goals: Good    Frequency 7X/week   Barriers to discharge        Co-evaluation               AM-PAC PT "6 Clicks" Mobility  Outcome Measure Help needed turning from your back to your side while in a flat bed without using bedrails?: A Little Help needed moving from lying on your back to sitting on the side of a flat bed without using bedrails?: A Little Help needed moving to and from a bed to a chair (including a wheelchair)?: A Little Help needed standing up from a chair using your arms (e.g., wheelchair or bedside chair)?: A Little Help needed to walk in hospital room?: A Little Help needed climbing 3-5 steps with a railing? : A Little 6 Click Score: 18    End of Session Equipment Utilized During Treatment: Gait belt Activity Tolerance: Patient tolerated treatment well;Patient limited by pain Patient left: in chair;with call bell/phone within reach;with chair alarm set Nurse Communication: Mobility status PT Visit Diagnosis: Difficulty in walking, not elsewhere classified (R26.2)    Time: 1020-1058 PT Time Calculation (min) (ACUTE ONLY): 38 min   Charges:   PT Evaluation $PT Eval Low Complexity: 1 Low PT Treatments $Gait Training: 8-22 mins $Therapeutic Exercise: 8-22 mins        [place] Pager [phone] Office [phone]   Xylon Croom 01/25/2019, 12:58 PM

## 2019-01-25 NOTE — Progress Notes (Signed)
Physical Therapy Treatment Patient Details Name: Julia Boone MRN: [id number] DOB: 01-Jan-1946 Today's Date: 01/25/2019    History of Present Illness Pt s/p L THR    PT Comments    Pt progressing steadily with mobility despite c/o continued pain.  This pm, pt able to negotiate stairs with assist of spouse.  No c/o dizziness with mobilization.  Follow Up Recommendations  Home health PT;Follow surgeon's recommendation for DC plan and follow-up therapies     Equipment Recommendations  None recommended by PT    Recommendations for Other Services       Precautions / Restrictions Precautions Precautions: Fall Restrictions Weight Bearing Restrictions: No Other Position/Activity Restrictions: WBAT    Mobility  Bed Mobility               General bed mobility comments: NT - pt up in chair and requests back to same.  Transfers Overall transfer level: Needs assistance Equipment used: Rolling walker (2 wheeled) Transfers: Sit to/from Stand Sit to Stand: Min guard         General transfer comment: cues for LE management and use of UEs to self assist  Ambulation/Gait Ambulation/Gait assistance: Min guard;Supervision Gait Distance (Feet): 100 Feet Assistive device: Rolling walker (2 wheeled) Gait Pattern/deviations: Step-to pattern;Step-through pattern;Decreased step length - right;Decreased step length - left;Shuffle;Trunk flexed Gait velocity: decr   General Gait Details: cues for posture, position from RW and initial sequence   Stairs Stairs: Yes Stairs assistance: Min assist Stair Management: No rails;Step to pattern;Backwards;With walker Number of Stairs: 6 General stair comments: 2 steps x 3 with spouse assisting on last attempt and written instruction provided.  Cues for sequence and foot/RW placement   Wheelchair Mobility    Modified Rankin (Stroke Patients Only)       Balance Overall balance assessment: Mild deficits observed, not formally  tested                                          Cognition Arousal/Alertness: Awake/alert Behavior During Therapy: WFL for tasks assessed/performed Overall Cognitive Status: Within Functional Limits for tasks assessed                                        Exercises Total Joint Exercises Ankle Circles/Pumps: AROM;Both;15 reps;Supine Quad Sets: AROM;Both;10 reps;Supine Heel Slides: AAROM;Left;20 reps;Supine Hip ABduction/ADduction: AAROM;Right;15 reps;Supine    General Comments        Pertinent Vitals/Pain Pain Assessment: 0-10 Pain Score: 8  Pain Location: L hip Pain Descriptors / Indicators: Aching;Sore Pain Intervention(s): Limited activity within patient's tolerance;Monitored during session;Premedicated before session;Ice applied    Home Living Family/patient expects to be discharged to:: Private residence Living Arrangements: Spouse/significant other Available Help at Discharge: Family Type of Home: House Home Access: Stairs to enter Entrance Stairs-Rails: None Home Layout: One level Home Equipment: Environmental consultant - 2 wheels;Bedside commode      Prior Function Level of Independence: Independent          PT Goals (current goals can now be found in the care plan section) Acute Rehab PT Goals Patient Stated Goal: Regain IND PT Goal Formulation: With patient Time For Goal Achievement: 02/01/19 Potential to Achieve Goals: Good Progress towards PT goals: Progressing toward goals    Frequency    7X/week  PT Plan Current plan remains appropriate    Co-evaluation              AM-PAC PT "6 Clicks" Mobility   Outcome Measure  Help needed turning from your back to your side while in a flat bed without using bedrails?: A Little Help needed moving from lying on your back to sitting on the side of a flat bed without using bedrails?: A Little Help needed moving to and from a bed to a chair (including a wheelchair)?: A  Little Help needed standing up from a chair using your arms (e.g., wheelchair or bedside chair)?: A Little Help needed to walk in hospital room?: A Little Help needed climbing 3-5 steps with a railing? : A Little 6 Click Score: 18    End of Session Equipment Utilized During Treatment: Gait belt Activity Tolerance: Patient tolerated treatment well;Patient limited by pain Patient left: in chair;with call bell/phone within reach;with chair alarm set Nurse Communication: Mobility status PT Visit Diagnosis: Difficulty in walking, not elsewhere classified (R26.2)     Time: 1440-1510 PT Time Calculation (min) (ACUTE ONLY): 30 min  Charges:  $Gait Training: 8-22 mins $Therapeutic Exercise: 8-22 mins $Therapeutic Activity: 8-22 mins                     [place] Pager [phone] Office [phone]    Daray Polgar 01/25/2019, 3:30 PM

## 2019-01-25 NOTE — Care Management Obs Status (Signed)
[place] NOTIFICATION   Patient Details  Name: HILA BOLDING MRN: [id number] Date of Birth: 1946-07-29   Medicare Observation Status Notification Given:  Yes    Leeroy Cha, RN 01/25/2019, 3:53 PM

## 2019-01-25 NOTE — Progress Notes (Signed)
   01/24/19 2030  MEWS Score  BP (!) 98/54   RN will continue to monitor.

## 2019-01-25 NOTE — Progress Notes (Addendum)
Subjective: 1 Day Post-Op Procedure(s) (LRB): TOTAL HIP ARTHROPLASTY ANTERIOR APPROACH (Left)   Patient feels well and is hoping to go home today.   Activity level:  wbat Diet tolerance:  ok Voiding:  ok Patient reports pain as mild.    Objective: Vital signs in last 24 hours: Temp:  [97.4 F (36.3 C)-98.4 F (36.9 C)] 98.1 F (36.7 C) (02/19 0613) Pulse Rate:  [56-99] 99 (02/19 0613) Resp:  [9-18] 16 (02/19 0613) BP: (75-141)/(36-87) 97/53 (02/19 0613) SpO2:  [94 %-100 %] 96 % (02/19 0613) Weight:  [68.5 kg] 68.5 kg (02/18 1111)  Labs: No results for input(s): HGB in the last 72 hours. No results for input(s): WBC, RBC, HCT, PLT in the last 72 hours. No results for input(s): NA, K, CL, CO2, BUN, CREATININE, GLUCOSE, CALCIUM in the last 72 hours. No results for input(s): LABPT, INR in the last 72 hours.  Physical Exam:  Neurologically intact ABD soft Neurovascular intact Sensation intact distally Intact pulses distally Dorsiflexion/Plantar flexion intact Incision: dressing C/D/I and no drainage No cellulitis present Compartment soft  Assessment/Plan:  1 Day Post-Op Procedure(s) (LRB): TOTAL HIP ARTHROPLASTY ANTERIOR APPROACH (Left) Advance diet Up with therapy Discharge home with home health today if she does well with PT and her BP improves. We will hold BP meds. Continue on ASA 325mg  BID x 4 weeks post op. Follow up in office 2 weeks post op.   Larwance Sachs Zanaya Baize 01/25/2019, 7:56 AM

## 2019-01-25 NOTE — Care Management Note (Signed)
Case Management Note  Patient Details  Name: Julia Boone MRN: [id number] Date of Birth: 11/16/1946  Subjective/Objective:                  Discharge planning  Action/Plan: Plan is to go home with pt through [facility], equpiment delivered  Expected Discharge Date:                  Expected Discharge Plan:  Home/Self Care  In-House Referral:     Discharge planning Services  CM Consult  Post Acute Care Choice:  Durable Medical Equipment Choice offered to:     DME Arranged:  Walker rolling DME Agency:  [employer]  HH Arranged:  PT [place] Agency:  [facility] (formerly [employer])  Status of Service:  Completed, signed off  If discussed at [employer], dates discussed:    Additional Comments:  Leeroy Cha, RN 01/25/2019, 9:52 AM

## 2019-01-27 ENCOUNTER — Encounter (HOSPITAL_COMMUNITY): Payer: Self-pay | Admitting: Orthopaedic Surgery

## 2019-01-27 DIAGNOSIS — M797 Fibromyalgia: Secondary | ICD-10-CM | POA: Diagnosis not present

## 2019-01-27 DIAGNOSIS — I8390 Asymptomatic varicose veins of unspecified lower extremity: Secondary | ICD-10-CM | POA: Diagnosis not present

## 2019-01-27 DIAGNOSIS — I10 Essential (primary) hypertension: Secondary | ICD-10-CM | POA: Diagnosis not present

## 2019-01-27 DIAGNOSIS — E785 Hyperlipidemia, unspecified: Secondary | ICD-10-CM | POA: Diagnosis not present

## 2019-01-27 DIAGNOSIS — K219 Gastro-esophageal reflux disease without esophagitis: Secondary | ICD-10-CM | POA: Diagnosis not present

## 2019-01-27 DIAGNOSIS — Z96642 Presence of left artificial hip joint: Secondary | ICD-10-CM | POA: Diagnosis not present

## 2019-01-27 DIAGNOSIS — Z471 Aftercare following joint replacement surgery: Secondary | ICD-10-CM | POA: Diagnosis not present

## 2019-01-27 DIAGNOSIS — M810 Age-related osteoporosis without current pathological fracture: Secondary | ICD-10-CM | POA: Diagnosis not present

## 2019-01-27 DIAGNOSIS — Z87891 Personal history of nicotine dependence: Secondary | ICD-10-CM | POA: Diagnosis not present

## 2019-01-27 DIAGNOSIS — K635 Polyp of colon: Secondary | ICD-10-CM | POA: Diagnosis not present

## 2019-01-27 DIAGNOSIS — Z7982 Long term (current) use of aspirin: Secondary | ICD-10-CM | POA: Diagnosis not present

## 2019-01-27 DIAGNOSIS — Z9181 History of falling: Secondary | ICD-10-CM | POA: Diagnosis not present

## 2019-01-27 NOTE — Anesthesia Postprocedure Evaluation (Signed)
Anesthesia Post Note  Patient: Julia Boone  Procedure(s) Performed: TOTAL HIP ARTHROPLASTY ANTERIOR APPROACH (Left Hip)     Patient location during evaluation: PACU Anesthesia Type: MAC Level of consciousness: oriented and awake and alert Pain management: pain level controlled Vital Signs Assessment: post-procedure vital signs reviewed and stable Respiratory status: spontaneous breathing, respiratory function stable and patient connected to nasal cannula oxygen Cardiovascular status: blood pressure returned to baseline and stable Postop Assessment: no headache, no backache and no apparent nausea or vomiting Anesthetic complications: no    Last Vitals:  Vitals:   01/25/19 1334 01/25/19 1351  BP: (!) 123/45 91/63  Pulse: (!) 101 98  Resp: 17   Temp: 36.7 C   SpO2: 91% 94%    Last Pain:  Vitals:   01/25/19 1451  TempSrc:   PainSc: 3                  Su Duma COKER

## 2019-01-28 DIAGNOSIS — M797 Fibromyalgia: Secondary | ICD-10-CM | POA: Diagnosis not present

## 2019-01-28 DIAGNOSIS — I8390 Asymptomatic varicose veins of unspecified lower extremity: Secondary | ICD-10-CM | POA: Diagnosis not present

## 2019-01-28 DIAGNOSIS — M810 Age-related osteoporosis without current pathological fracture: Secondary | ICD-10-CM | POA: Diagnosis not present

## 2019-01-28 DIAGNOSIS — I10 Essential (primary) hypertension: Secondary | ICD-10-CM | POA: Diagnosis not present

## 2019-01-28 DIAGNOSIS — E785 Hyperlipidemia, unspecified: Secondary | ICD-10-CM | POA: Diagnosis not present

## 2019-01-28 DIAGNOSIS — Z471 Aftercare following joint replacement surgery: Secondary | ICD-10-CM | POA: Diagnosis not present

## 2019-01-29 DIAGNOSIS — I8390 Asymptomatic varicose veins of unspecified lower extremity: Secondary | ICD-10-CM | POA: Diagnosis not present

## 2019-01-29 DIAGNOSIS — Z471 Aftercare following joint replacement surgery: Secondary | ICD-10-CM | POA: Diagnosis not present

## 2019-01-29 DIAGNOSIS — I10 Essential (primary) hypertension: Secondary | ICD-10-CM | POA: Diagnosis not present

## 2019-01-29 DIAGNOSIS — M797 Fibromyalgia: Secondary | ICD-10-CM | POA: Diagnosis not present

## 2019-01-29 DIAGNOSIS — E785 Hyperlipidemia, unspecified: Secondary | ICD-10-CM | POA: Diagnosis not present

## 2019-01-29 DIAGNOSIS — M810 Age-related osteoporosis without current pathological fracture: Secondary | ICD-10-CM | POA: Diagnosis not present

## 2019-01-31 DIAGNOSIS — Z471 Aftercare following joint replacement surgery: Secondary | ICD-10-CM | POA: Diagnosis not present

## 2019-01-31 DIAGNOSIS — M810 Age-related osteoporosis without current pathological fracture: Secondary | ICD-10-CM | POA: Diagnosis not present

## 2019-01-31 DIAGNOSIS — E785 Hyperlipidemia, unspecified: Secondary | ICD-10-CM | POA: Diagnosis not present

## 2019-01-31 DIAGNOSIS — I10 Essential (primary) hypertension: Secondary | ICD-10-CM | POA: Diagnosis not present

## 2019-01-31 DIAGNOSIS — I8390 Asymptomatic varicose veins of unspecified lower extremity: Secondary | ICD-10-CM | POA: Diagnosis not present

## 2019-01-31 DIAGNOSIS — M797 Fibromyalgia: Secondary | ICD-10-CM | POA: Diagnosis not present

## 2019-02-02 DIAGNOSIS — E785 Hyperlipidemia, unspecified: Secondary | ICD-10-CM | POA: Diagnosis not present

## 2019-02-02 DIAGNOSIS — I8390 Asymptomatic varicose veins of unspecified lower extremity: Secondary | ICD-10-CM | POA: Diagnosis not present

## 2019-02-02 DIAGNOSIS — M797 Fibromyalgia: Secondary | ICD-10-CM | POA: Diagnosis not present

## 2019-02-02 DIAGNOSIS — Z471 Aftercare following joint replacement surgery: Secondary | ICD-10-CM | POA: Diagnosis not present

## 2019-02-02 DIAGNOSIS — M810 Age-related osteoporosis without current pathological fracture: Secondary | ICD-10-CM | POA: Diagnosis not present

## 2019-02-02 DIAGNOSIS — I10 Essential (primary) hypertension: Secondary | ICD-10-CM | POA: Diagnosis not present

## 2019-02-04 DIAGNOSIS — E785 Hyperlipidemia, unspecified: Secondary | ICD-10-CM | POA: Diagnosis not present

## 2019-02-04 DIAGNOSIS — Z471 Aftercare following joint replacement surgery: Secondary | ICD-10-CM | POA: Diagnosis not present

## 2019-02-04 DIAGNOSIS — M810 Age-related osteoporosis without current pathological fracture: Secondary | ICD-10-CM | POA: Diagnosis not present

## 2019-02-04 DIAGNOSIS — I10 Essential (primary) hypertension: Secondary | ICD-10-CM | POA: Diagnosis not present

## 2019-02-04 DIAGNOSIS — M797 Fibromyalgia: Secondary | ICD-10-CM | POA: Diagnosis not present

## 2019-02-04 DIAGNOSIS — I8390 Asymptomatic varicose veins of unspecified lower extremity: Secondary | ICD-10-CM | POA: Diagnosis not present

## 2019-02-06 DIAGNOSIS — M1612 Unilateral primary osteoarthritis, left hip: Secondary | ICD-10-CM | POA: Diagnosis not present

## 2019-02-06 DIAGNOSIS — M6281 Muscle weakness (generalized): Secondary | ICD-10-CM | POA: Diagnosis not present

## 2019-02-06 DIAGNOSIS — Z471 Aftercare following joint replacement surgery: Secondary | ICD-10-CM | POA: Diagnosis not present

## 2019-02-06 DIAGNOSIS — Z96642 Presence of left artificial hip joint: Secondary | ICD-10-CM | POA: Diagnosis not present

## 2019-02-15 DIAGNOSIS — M6281 Muscle weakness (generalized): Secondary | ICD-10-CM | POA: Diagnosis not present

## 2019-02-15 DIAGNOSIS — Z96642 Presence of left artificial hip joint: Secondary | ICD-10-CM | POA: Diagnosis not present

## 2019-02-24 DIAGNOSIS — Z96642 Presence of left artificial hip joint: Secondary | ICD-10-CM | POA: Diagnosis not present

## 2019-02-24 DIAGNOSIS — M6281 Muscle weakness (generalized): Secondary | ICD-10-CM | POA: Diagnosis not present

## 2019-02-28 DIAGNOSIS — M6281 Muscle weakness (generalized): Secondary | ICD-10-CM | POA: Diagnosis not present

## 2019-02-28 DIAGNOSIS — Z96642 Presence of left artificial hip joint: Secondary | ICD-10-CM | POA: Diagnosis not present

## 2019-03-01 DIAGNOSIS — Z471 Aftercare following joint replacement surgery: Secondary | ICD-10-CM | POA: Diagnosis not present

## 2019-03-01 DIAGNOSIS — M1612 Unilateral primary osteoarthritis, left hip: Secondary | ICD-10-CM | POA: Diagnosis not present

## 2019-03-02 DIAGNOSIS — Z96642 Presence of left artificial hip joint: Secondary | ICD-10-CM | POA: Diagnosis not present

## 2019-03-02 DIAGNOSIS — M6281 Muscle weakness (generalized): Secondary | ICD-10-CM | POA: Diagnosis not present

## 2019-04-05 DIAGNOSIS — Z471 Aftercare following joint replacement surgery: Secondary | ICD-10-CM | POA: Diagnosis not present

## 2019-04-18 DIAGNOSIS — H2513 Age-related nuclear cataract, bilateral: Secondary | ICD-10-CM | POA: Diagnosis not present

## 2019-04-18 DIAGNOSIS — H04123 Dry eye syndrome of bilateral lacrimal glands: Secondary | ICD-10-CM | POA: Diagnosis not present

## 2019-07-25 DIAGNOSIS — Z1389 Encounter for screening for other disorder: Secondary | ICD-10-CM | POA: Diagnosis not present

## 2019-07-25 DIAGNOSIS — E7800 Pure hypercholesterolemia, unspecified: Secondary | ICD-10-CM | POA: Diagnosis not present

## 2019-07-25 DIAGNOSIS — M810 Age-related osteoporosis without current pathological fracture: Secondary | ICD-10-CM | POA: Diagnosis not present

## 2019-07-25 DIAGNOSIS — I10 Essential (primary) hypertension: Secondary | ICD-10-CM | POA: Diagnosis not present

## 2019-07-25 DIAGNOSIS — R8290 Unspecified abnormal findings in urine: Secondary | ICD-10-CM | POA: Diagnosis not present

## 2019-07-25 DIAGNOSIS — K219 Gastro-esophageal reflux disease without esophagitis: Secondary | ICD-10-CM | POA: Diagnosis not present

## 2019-07-25 DIAGNOSIS — Z0000 Encounter for general adult medical examination without abnormal findings: Secondary | ICD-10-CM | POA: Diagnosis not present

## 2019-07-25 DIAGNOSIS — M797 Fibromyalgia: Secondary | ICD-10-CM | POA: Diagnosis not present

## 2019-07-25 DIAGNOSIS — Z23 Encounter for immunization: Secondary | ICD-10-CM | POA: Diagnosis not present

## 2019-07-25 DIAGNOSIS — R3129 Other microscopic hematuria: Secondary | ICD-10-CM | POA: Diagnosis not present

## 2019-09-22 DIAGNOSIS — Z23 Encounter for immunization: Secondary | ICD-10-CM | POA: Diagnosis not present

## 2019-09-26 DIAGNOSIS — Z8262 Family history of osteoporosis: Secondary | ICD-10-CM | POA: Diagnosis not present

## 2019-09-26 DIAGNOSIS — Z96642 Presence of left artificial hip joint: Secondary | ICD-10-CM | POA: Diagnosis not present

## 2019-09-26 DIAGNOSIS — R29890 Loss of height: Secondary | ICD-10-CM | POA: Diagnosis not present

## 2019-09-26 DIAGNOSIS — Z1231 Encounter for screening mammogram for malignant neoplasm of breast: Secondary | ICD-10-CM | POA: Diagnosis not present

## 2019-09-26 DIAGNOSIS — M8589 Other specified disorders of bone density and structure, multiple sites: Secondary | ICD-10-CM | POA: Diagnosis not present

## 2019-09-26 DIAGNOSIS — N390 Urinary tract infection, site not specified: Secondary | ICD-10-CM | POA: Diagnosis not present

## 2019-10-18 DIAGNOSIS — S92302A Fracture of unspecified metatarsal bone(s), left foot, initial encounter for closed fracture: Secondary | ICD-10-CM | POA: Diagnosis not present

## 2019-10-18 DIAGNOSIS — S99922A Unspecified injury of left foot, initial encounter: Secondary | ICD-10-CM | POA: Diagnosis not present

## 2020-01-23 DIAGNOSIS — I8311 Varicose veins of right lower extremity with inflammation: Secondary | ICD-10-CM | POA: Diagnosis not present

## 2020-01-23 DIAGNOSIS — I83813 Varicose veins of bilateral lower extremities with pain: Secondary | ICD-10-CM | POA: Diagnosis not present

## 2020-01-23 DIAGNOSIS — I8312 Varicose veins of left lower extremity with inflammation: Secondary | ICD-10-CM | POA: Diagnosis not present

## 2020-02-06 DIAGNOSIS — Z03818 Encounter for observation for suspected exposure to other biological agents ruled out: Secondary | ICD-10-CM | POA: Diagnosis not present

## 2020-02-06 DIAGNOSIS — Z20828 Contact with and (suspected) exposure to other viral communicable diseases: Secondary | ICD-10-CM | POA: Diagnosis not present

## 2020-02-14 DIAGNOSIS — I83893 Varicose veins of bilateral lower extremities with other complications: Secondary | ICD-10-CM | POA: Diagnosis not present

## 2020-02-14 DIAGNOSIS — I8311 Varicose veins of right lower extremity with inflammation: Secondary | ICD-10-CM | POA: Diagnosis not present

## 2020-02-14 DIAGNOSIS — I83813 Varicose veins of bilateral lower extremities with pain: Secondary | ICD-10-CM | POA: Diagnosis not present

## 2020-02-14 DIAGNOSIS — I8312 Varicose veins of left lower extremity with inflammation: Secondary | ICD-10-CM | POA: Diagnosis not present

## 2020-02-24 DIAGNOSIS — Z23 Encounter for immunization: Secondary | ICD-10-CM | POA: Diagnosis not present

## 2020-02-28 DIAGNOSIS — I8312 Varicose veins of left lower extremity with inflammation: Secondary | ICD-10-CM | POA: Diagnosis not present

## 2020-02-28 DIAGNOSIS — I8311 Varicose veins of right lower extremity with inflammation: Secondary | ICD-10-CM | POA: Diagnosis not present

## 2020-02-28 DIAGNOSIS — I83813 Varicose veins of bilateral lower extremities with pain: Secondary | ICD-10-CM | POA: Diagnosis not present

## 2020-03-16 DIAGNOSIS — Z23 Encounter for immunization: Secondary | ICD-10-CM | POA: Diagnosis not present

## 2020-03-19 DIAGNOSIS — I8311 Varicose veins of right lower extremity with inflammation: Secondary | ICD-10-CM | POA: Diagnosis not present

## 2020-03-27 DIAGNOSIS — I83812 Varicose veins of left lower extremities with pain: Secondary | ICD-10-CM | POA: Diagnosis not present

## 2020-03-27 DIAGNOSIS — I8312 Varicose veins of left lower extremity with inflammation: Secondary | ICD-10-CM | POA: Diagnosis not present

## 2020-04-02 DIAGNOSIS — I83811 Varicose veins of right lower extremities with pain: Secondary | ICD-10-CM | POA: Diagnosis not present

## 2020-04-02 DIAGNOSIS — I8311 Varicose veins of right lower extremity with inflammation: Secondary | ICD-10-CM | POA: Diagnosis not present

## 2020-04-09 DIAGNOSIS — I8312 Varicose veins of left lower extremity with inflammation: Secondary | ICD-10-CM | POA: Diagnosis not present

## 2020-04-23 DIAGNOSIS — H2513 Age-related nuclear cataract, bilateral: Secondary | ICD-10-CM | POA: Diagnosis not present

## 2020-04-23 DIAGNOSIS — H524 Presbyopia: Secondary | ICD-10-CM | POA: Diagnosis not present

## 2020-08-08 DIAGNOSIS — M810 Age-related osteoporosis without current pathological fracture: Secondary | ICD-10-CM | POA: Diagnosis not present

## 2020-08-08 DIAGNOSIS — Z0000 Encounter for general adult medical examination without abnormal findings: Secondary | ICD-10-CM | POA: Diagnosis not present

## 2020-08-08 DIAGNOSIS — E7800 Pure hypercholesterolemia, unspecified: Secondary | ICD-10-CM | POA: Diagnosis not present

## 2020-08-08 DIAGNOSIS — I10 Essential (primary) hypertension: Secondary | ICD-10-CM | POA: Diagnosis not present

## 2020-08-08 DIAGNOSIS — M797 Fibromyalgia: Secondary | ICD-10-CM | POA: Diagnosis not present

## 2020-08-08 DIAGNOSIS — Z1389 Encounter for screening for other disorder: Secondary | ICD-10-CM | POA: Diagnosis not present

## 2020-08-08 DIAGNOSIS — K219 Gastro-esophageal reflux disease without esophagitis: Secondary | ICD-10-CM | POA: Diagnosis not present

## 2020-08-08 DIAGNOSIS — Z1159 Encounter for screening for other viral diseases: Secondary | ICD-10-CM | POA: Diagnosis not present

## 2020-10-01 DIAGNOSIS — Z1231 Encounter for screening mammogram for malignant neoplasm of breast: Secondary | ICD-10-CM | POA: Diagnosis not present

## 2020-11-27 DIAGNOSIS — Z23 Encounter for immunization: Secondary | ICD-10-CM | POA: Diagnosis not present

## 2021-03-08 ENCOUNTER — Emergency Department (HOSPITAL_COMMUNITY): Payer: Medicare Other

## 2021-03-08 ENCOUNTER — Other Ambulatory Visit: Payer: Self-pay

## 2021-03-08 ENCOUNTER — Emergency Department (HOSPITAL_COMMUNITY)
Admission: EM | Admit: 2021-03-08 | Discharge: 2021-03-09 | Disposition: A | Discharge status: Home / Self Care | Payer: Medicare Other | Attending: Emergency Medicine | Admitting: Emergency Medicine

## 2021-03-08 ENCOUNTER — Encounter (HOSPITAL_COMMUNITY): Payer: Self-pay

## 2021-03-08 DIAGNOSIS — Z7982 Long term (current) use of aspirin: Secondary | ICD-10-CM | POA: Diagnosis not present

## 2021-03-08 DIAGNOSIS — W108XXA Fall (on) (from) other stairs and steps, initial encounter: Secondary | ICD-10-CM | POA: Insufficient documentation

## 2021-03-08 DIAGNOSIS — Y999 Unspecified external cause status: Secondary | ICD-10-CM | POA: Insufficient documentation

## 2021-03-08 DIAGNOSIS — Y92008 Other place in unspecified non-institutional (private) residence as the place of occurrence of the external cause: Secondary | ICD-10-CM | POA: Diagnosis not present

## 2021-03-08 DIAGNOSIS — S52591A Other fractures of lower end of right radius, initial encounter for closed fracture: Secondary | ICD-10-CM | POA: Diagnosis not present

## 2021-03-08 DIAGNOSIS — Z79899 Other long term (current) drug therapy: Secondary | ICD-10-CM | POA: Insufficient documentation

## 2021-03-08 DIAGNOSIS — Y9389 Activity, other specified: Secondary | ICD-10-CM | POA: Insufficient documentation

## 2021-03-08 DIAGNOSIS — S62101A Fracture of unspecified carpal bone, right wrist, initial encounter for closed fracture: Secondary | ICD-10-CM

## 2021-03-08 DIAGNOSIS — M7989 Other specified soft tissue disorders: Secondary | ICD-10-CM | POA: Diagnosis not present

## 2021-03-08 DIAGNOSIS — I10 Essential (primary) hypertension: Secondary | ICD-10-CM | POA: Insufficient documentation

## 2021-03-08 DIAGNOSIS — M25531 Pain in right wrist: Secondary | ICD-10-CM | POA: Diagnosis not present

## 2021-03-08 DIAGNOSIS — S52501A Unspecified fracture of the lower end of right radius, initial encounter for closed fracture: Secondary | ICD-10-CM | POA: Diagnosis not present

## 2021-03-08 DIAGNOSIS — S6991XA Unspecified injury of right wrist, hand and finger(s), initial encounter: Secondary | ICD-10-CM | POA: Diagnosis present

## 2021-03-08 MED ORDER — OXYCODONE-ACETAMINOPHEN 5-325 MG PO TABS
2.0000 | ORAL_TABLET | Freq: Once | ORAL | Status: AC
  Administered 2021-03-08: 2 via ORAL
  Filled 2021-03-08: qty 2

## 2021-03-08 NOTE — ED Notes (Signed)
Patient transported to X-ray 

## 2021-03-08 NOTE — ED Provider Notes (Signed)
MSE was initiated and I personally evaluated the patient and placed orders (if any) at  10:09 PM on March 08, 2021.  The patient appears stable so that the remainder of the MSE may be completed by another provider.  Patient here with right wrist injury.  Patient report husband had a seizure and is currently in the ER.  She rushed out of her house, she tripped on two steps, fell and injured her right wrist.  Denies hitting her head or loss of consciousness she suffered a [place] injury.  Also hit her right hip and buttock but denies any significant hip buttock pain.  Pain is primarily to her right wrist.  On exam she does have close deformity about the right wrist with intact radial pulse and distal sensation.  No elbow or shoulder pain.  No significant hip or back tenderness on exam.   Domenic Moras, PA-C 03/08/21 2211    Arnaldo Natal, MD 03/09/21 [phone]

## 2021-03-08 NOTE — ED Triage Notes (Signed)
Right wrist deformity due to mechanical fall. Denies any use of thinners, denies LOC.

## 2021-03-09 ENCOUNTER — Other Ambulatory Visit: Payer: Self-pay

## 2021-03-09 ENCOUNTER — Emergency Department (HOSPITAL_COMMUNITY): Payer: Medicare Other

## 2021-03-09 DIAGNOSIS — S52591A Other fractures of lower end of right radius, initial encounter for closed fracture: Secondary | ICD-10-CM | POA: Diagnosis not present

## 2021-03-09 MED ORDER — OXYCODONE-ACETAMINOPHEN 5-325 MG PO TABS: 1.0000 | ORAL_TABLET | Freq: Four times a day (QID) | ORAL | 0 refills | Status: AC | PRN

## 2021-03-09 MED ORDER — LIDOCAINE HCL (PF) 1 % IJ SOLN
30.0000 mL | Freq: Once | INTRAMUSCULAR | Status: AC
  Administered 2021-03-09: 30 mL
  Filled 2021-03-09: qty 30

## 2021-03-09 NOTE — Progress Notes (Signed)
Orthopedic Tech Progress Note Patient Details:  Julia Boone [birth year] [id number]  Ortho Devices Type of Ortho Device: Arm sling,Sugartong splint,Finger trap Finger Trap Weight: 5 lbs Ortho Device/Splint Location: rue Ortho Device/Splint Interventions: Ordered,Application,Adjustment   Post Interventions Patient Tolerated: Well Instructions Provided: Care of device,Adjustment of device   Karolee Stamps 03/09/2021, 3:17 AM

## 2021-03-11 DIAGNOSIS — S52501D Unspecified fracture of the lower end of right radius, subsequent encounter for closed fracture with routine healing: Secondary | ICD-10-CM | POA: Diagnosis not present

## 2021-03-19 NOTE — ED Provider Notes (Signed)
[facility] EMERGENCY DEPARTMENT Provider Note   CSN: [id number] Arrival date & time: 03/08/21  2158     History Chief Complaint  Patient presents with  . Wrist Pain    Julia Boone is a 75 y.o. female.   Wrist Pain This is a new problem. The current episode started 1 to 2 hours ago. The problem occurs constantly. The problem has not changed since onset.Pertinent negatives include no chest pain, no abdominal pain, no headaches and no shortness of breath. Nothing aggravates the symptoms. Nothing relieves the symptoms. She has tried nothing for the symptoms. The treatment provided no relief.  Fall Pertinent negatives include no chest pain, no abdominal pain, no headaches and no shortness of breath.       Past Medical History:  Diagnosis Date  . Arthritis   . Bursitis    left hip,[place]  . DJD (degenerative joint disease)   . Fibromyalgia   . GERD (gastroesophageal reflux disease)   . H/O gastroesophageal reflux (GERD)   . Heart murmur   . Hx of cystoscopy    negative  . Hyperlipidemia   . Hypertension   . Microhematuria    N[place], CT right renal cyst,  . Osteoporosis   . Polyp of colon, hyperplastic   . Varicose veins     Patient Active Problem List   Diagnosis Date Noted  . Primary osteoarthritis of left hip 01/25/2019  . Primary localized osteoarthritis of left hip 01/24/2019    Past Surgical History:  Procedure Laterality Date  . RETINAL DETACHMENT SURGERY     2014  . TOTAL HIP ARTHROPLASTY Left 01/24/2019   Procedure: TOTAL HIP ARTHROPLASTY ANTERIOR APPROACH;  Surgeon: Melrose Nakayama, MD;  Location: [facility] ORS;  Service: Orthopedics;  Laterality: Left;     OB History   No obstetric history on file.     History reviewed. No pertinent family history.  Social History   Tobacco Use  . Smoking status: Former Smoker    Years: 20.00    Types: Cigarettes    Quit date: 01/17/2004    Years since quitting: 17.1  . Smokeless tobacco:  Never Used  Vaping Use  . Vaping Use: Never used  Substance Use Topics  . Alcohol use: Not Currently  . Drug use: Never    Home Medications Prior to Admission medications   Medication Sig Start Date End Date Taking? Authorizing Provider  oxyCODONE-acetaminophen (PERCOCET) 5-325 MG tablet Take 1 tablet by mouth every 6 (six) hours as needed for severe pain. 03/09/21  Yes Nahomi Hegner, Corene Cornea, MD  acetaminophen (TYLENOL) 500 MG tablet Take 1,000 mg by mouth 2 (two) times daily as needed for moderate pain.    [provider]  alendronate (FOSAMAX) 70 MG tablet Take 70 mg by mouth once a week. Take with a full glass of water on an empty stomach.    [provider]  aspirin EC 325 MG EC tablet Take 1 tablet (325 mg total) by mouth 2 (two) times daily after a meal. 01/25/19   Loni Dolly, PA-C  atorvastatin (LIPITOR) 10 MG tablet Take 10 mg by mouth daily.    [provider]  atorvastatin (LIPITOR) 10 MG tablet Take 10 mg by mouth at bedtime.    [provider]  Calcium Citrate-Vitamin D (CALCIUM + D PO) Take 1 tablet by mouth at bedtime.    [provider]  cyclobenzaprine (FLEXERIL) 10 MG tablet Take 10 mg by mouth 3 (three) times daily as needed  for muscle spasms.    [provider]  DULoxetine (CYMBALTA) 60 MG capsule Take 60 mg by mouth daily.    [provider]  DULoxetine (CYMBALTA) 60 MG capsule Take 60 mg by mouth at bedtime.    [provider]  hydrochlorothiazide (MICROZIDE) 12.5 MG capsule Take 12.5 mg by mouth at bedtime.    [provider]  HYDROcodone-acetaminophen (NORCO/VICODIN) 5-325 MG tablet Take 1-2 tablets by mouth every 6 (six) hours as needed for moderate pain (pain score 4-6). 01/25/19   Loni Dolly, PA-C  losartan (COZAAR) 50 MG tablet Take 50 mg by mouth at bedtime.    [provider]  losartan-hydrochlorothiazide (HYZAAR) 50-12.5 MG per tablet Take 1 tablet by mouth daily.    [provider]  Multiple Vitamin (MULTIVITAMIN WITH MINERALS) TABS tablet Take 1 tablet by mouth at bedtime.    [provider]  omeprazole (PRILOSEC) 20 MG capsule Take 20 mg by mouth daily.    [provider]  omeprazole (PRILOSEC) 20 MG capsule Take 20 mg by mouth daily as needed (acid reflux).    [provider]  Polyethylene Glycol 400 (BLINK TEARS) 0.25 % SOLN Place 1 drop into both eyes daily as needed (dry eyes).    [provider]  potassium chloride (K-DUR,KLOR-CON) 10 MEQ tablet Take 10 mEq by mouth at bedtime.    [provider]    Allergies    Penicillins, Lisinopril, and Penicillins  Review of Systems   Review of Systems  Respiratory: Negative for shortness of breath.   Cardiovascular: Negative for chest pain.  Gastrointestinal: Negative for abdominal pain.  Neurological: Negative for headaches.  All other systems reviewed and are negative.   Physical Exam Updated Vital Signs BP (!) 146/64 (BP Location: Left Arm)   Pulse 90   Temp 98 F (36.7 C) (Oral)   Resp 16   Ht 5\' 3"  (1.6 m)   Wt 68.5 kg   SpO2 97%   BMI 26.75 kg/m   Physical Exam Vitals and nursing note reviewed.  Constitutional:      Appearance: She is well-developed.  HENT:     Head: Normocephalic and atraumatic.     Nose: Nose normal. No congestion or rhinorrhea.     Mouth/Throat:     Mouth: Mucous membranes are moist.     Pharynx: Oropharynx is clear.  Eyes:     Pupils: Pupils are equal, round, and reactive to light.  Cardiovascular:     Rate and Rhythm: Normal rate and regular rhythm.  Pulmonary:     Effort: No respiratory distress.     Breath sounds: No stridor.  Abdominal:     General: Abdomen is flat. There is no distension.  Musculoskeletal:        General: Tenderness, deformity (right wrist) and signs of injury present. Normal range of motion.     Cervical back: Normal range of motion.  Skin:    General: Skin is warm and dry.   Neurological:     General: No focal deficit present.     Mental Status: She is alert.     ED Results / Procedures / Treatments   Labs (all labs ordered are listed, but only abnormal results are displayed) Labs Reviewed - No data to display  EKG None  Radiology No results found.  Procedures Reduction of fracture  Date/Time: 03/19/2021 3:22 AM Performed by: Merrily Pew, MD Authorized by: Merrily Pew, MD  Consent: Verbal consent obtained. Risks and benefits:  risks, benefits and alternatives were discussed Consent given by: patient Patient understanding: patient states understanding of the procedure being performed Patient consent: the patient's understanding of the procedure matches consent given Test results: test results available and properly labeled Site marked: the operative site was marked Imaging studies: imaging studies available Required items: required blood products, implants, devices, and special equipment available Patient identity confirmed: verbally with patient and arm band Time out: Immediately prior to procedure a "time out" was called to verify the correct patient, procedure, equipment, support staff and site/side marked as required. Preparation: Patient was prepped and draped in the usual sterile fashion. Local anesthesia used: yes Anesthesia: hematoma block  Anesthesia: Local anesthesia used: yes Local Anesthetic: lidocaine 1% without epinephrine Anesthetic total: 10 mL  Sedation: Patient sedated: no  Patient tolerance: patient tolerated the procedure well with no immediate complications Comments: Attempted reduction of right wrist.  Slightly improved on xray.       Medications Ordered in ED Medications  oxyCODONE-acetaminophen (PERCOCET/ROXICET) 5-325 MG per tablet 2 tablet (2 tablets Oral Given 03/08/21 2223)  lidocaine (PF) (XYLOCAINE) 1 % injection 30 mL (30 mLs Other Given 03/09/21 0303)    ED Course  I have reviewed the triage vital  signs and the nursing notes.  Pertinent labs & imaging results that were available during my care of the patient were reviewed by me and considered in my medical decision making (see chart for details).    MDM Rules/Calculators/A&P                          Wrist fracture. Slightly improved. Plan for hand surgery follow up. NVI. No other obvious injuries.   Final Clinical Impression(s) / ED Diagnoses Final diagnoses:  Closed fracture of right wrist, initial encounter    Rx / DC Orders ED Discharge Orders         Ordered    oxyCODONE-acetaminophen (PERCOCET) 5-325 MG tablet  Every 6 hours PRN        03/09/21 0320           Kimball Appleby, Corene Cornea, MD 03/19/21 [phone]

## 2021-03-26 DIAGNOSIS — S52571A Other intraarticular fracture of lower end of right radius, initial encounter for closed fracture: Secondary | ICD-10-CM | POA: Diagnosis not present

## 2021-03-26 DIAGNOSIS — Y999 Unspecified external cause status: Secondary | ICD-10-CM | POA: Diagnosis not present

## 2021-03-26 DIAGNOSIS — G8918 Other acute postprocedural pain: Secondary | ICD-10-CM | POA: Diagnosis not present

## 2021-04-03 DIAGNOSIS — I8312 Varicose veins of left lower extremity with inflammation: Secondary | ICD-10-CM | POA: Diagnosis not present

## 2021-04-03 DIAGNOSIS — I8311 Varicose veins of right lower extremity with inflammation: Secondary | ICD-10-CM | POA: Diagnosis not present

## 2021-04-11 DIAGNOSIS — S52531D Colles' fracture of right radius, subsequent encounter for closed fracture with routine healing: Secondary | ICD-10-CM | POA: Diagnosis not present

## 2021-04-11 DIAGNOSIS — Z4789 Encounter for other orthopedic aftercare: Secondary | ICD-10-CM | POA: Diagnosis not present

## 2021-04-24 DIAGNOSIS — M25631 Stiffness of right wrist, not elsewhere classified: Secondary | ICD-10-CM | POA: Diagnosis not present

## 2021-04-24 DIAGNOSIS — S52531D Colles' fracture of right radius, subsequent encounter for closed fracture with routine healing: Secondary | ICD-10-CM | POA: Diagnosis not present

## 2021-04-24 DIAGNOSIS — Z4789 Encounter for other orthopedic aftercare: Secondary | ICD-10-CM | POA: Diagnosis not present

## 2021-04-29 DIAGNOSIS — H2513 Age-related nuclear cataract, bilateral: Secondary | ICD-10-CM | POA: Diagnosis not present

## 2021-04-29 DIAGNOSIS — H524 Presbyopia: Secondary | ICD-10-CM | POA: Diagnosis not present

## 2021-05-06 DIAGNOSIS — M25641 Stiffness of right hand, not elsewhere classified: Secondary | ICD-10-CM | POA: Diagnosis not present

## 2021-05-13 DIAGNOSIS — M25641 Stiffness of right hand, not elsewhere classified: Secondary | ICD-10-CM | POA: Diagnosis not present

## 2021-05-20 DIAGNOSIS — M25641 Stiffness of right hand, not elsewhere classified: Secondary | ICD-10-CM | POA: Diagnosis not present

## 2021-05-22 DIAGNOSIS — Z4789 Encounter for other orthopedic aftercare: Secondary | ICD-10-CM | POA: Diagnosis not present

## 2021-05-22 DIAGNOSIS — S52531D Colles' fracture of right radius, subsequent encounter for closed fracture with routine healing: Secondary | ICD-10-CM | POA: Diagnosis not present

## 2021-08-19 DIAGNOSIS — Z1389 Encounter for screening for other disorder: Secondary | ICD-10-CM | POA: Diagnosis not present

## 2021-08-19 DIAGNOSIS — I10 Essential (primary) hypertension: Secondary | ICD-10-CM | POA: Diagnosis not present

## 2021-08-19 DIAGNOSIS — K219 Gastro-esophageal reflux disease without esophagitis: Secondary | ICD-10-CM | POA: Diagnosis not present

## 2021-08-19 DIAGNOSIS — M797 Fibromyalgia: Secondary | ICD-10-CM | POA: Diagnosis not present

## 2021-08-19 DIAGNOSIS — F4321 Adjustment disorder with depressed mood: Secondary | ICD-10-CM | POA: Diagnosis not present

## 2021-08-19 DIAGNOSIS — E7800 Pure hypercholesterolemia, unspecified: Secondary | ICD-10-CM | POA: Diagnosis not present

## 2021-08-19 DIAGNOSIS — Z0000 Encounter for general adult medical examination without abnormal findings: Secondary | ICD-10-CM | POA: Diagnosis not present

## 2021-08-19 DIAGNOSIS — M810 Age-related osteoporosis without current pathological fracture: Secondary | ICD-10-CM | POA: Diagnosis not present

## 2021-08-21 DIAGNOSIS — S52531D Colles' fracture of right radius, subsequent encounter for closed fracture with routine healing: Secondary | ICD-10-CM | POA: Diagnosis not present

## 2021-08-25 DIAGNOSIS — U071 COVID-19: Secondary | ICD-10-CM | POA: Diagnosis not present

## 2021-10-07 DIAGNOSIS — Z1231 Encounter for screening mammogram for malignant neoplasm of breast: Secondary | ICD-10-CM | POA: Diagnosis not present

## 2021-12-23 DIAGNOSIS — Z1211 Encounter for screening for malignant neoplasm of colon: Secondary | ICD-10-CM | POA: Diagnosis not present

## 2021-12-23 DIAGNOSIS — Z800 Family history of malignant neoplasm of digestive organs: Secondary | ICD-10-CM | POA: Diagnosis not present

## 2021-12-23 DIAGNOSIS — K5730 Diverticulosis of large intestine without perforation or abscess without bleeding: Secondary | ICD-10-CM | POA: Diagnosis not present

## 2022-05-05 DIAGNOSIS — H2513 Age-related nuclear cataract, bilateral: Secondary | ICD-10-CM | POA: Diagnosis not present

## 2022-05-05 DIAGNOSIS — H524 Presbyopia: Secondary | ICD-10-CM | POA: Diagnosis not present

## 2022-08-28 DIAGNOSIS — E7800 Pure hypercholesterolemia, unspecified: Secondary | ICD-10-CM | POA: Diagnosis not present

## 2022-08-28 DIAGNOSIS — M797 Fibromyalgia: Secondary | ICD-10-CM | POA: Diagnosis not present

## 2022-08-28 DIAGNOSIS — Z0000 Encounter for general adult medical examination without abnormal findings: Secondary | ICD-10-CM | POA: Diagnosis not present

## 2022-08-28 DIAGNOSIS — K219 Gastro-esophageal reflux disease without esophagitis: Secondary | ICD-10-CM | POA: Diagnosis not present

## 2022-08-28 DIAGNOSIS — I10 Essential (primary) hypertension: Secondary | ICD-10-CM | POA: Diagnosis not present

## 2022-08-28 DIAGNOSIS — M810 Age-related osteoporosis without current pathological fracture: Secondary | ICD-10-CM | POA: Diagnosis not present

## 2022-08-28 DIAGNOSIS — Z23 Encounter for immunization: Secondary | ICD-10-CM | POA: Diagnosis not present

## 2022-10-07 IMAGING — DX DG WRIST COMPLETE 3+V*R*
3 series · 3 of 3 positions shown · non-contrast
Comparison: 03/08/2021

CLINICAL DATA: Right wrist fracture postreduction

EXAM:
RIGHT WRIST - COMPLETE 3+ VIEW

[wrist pa]
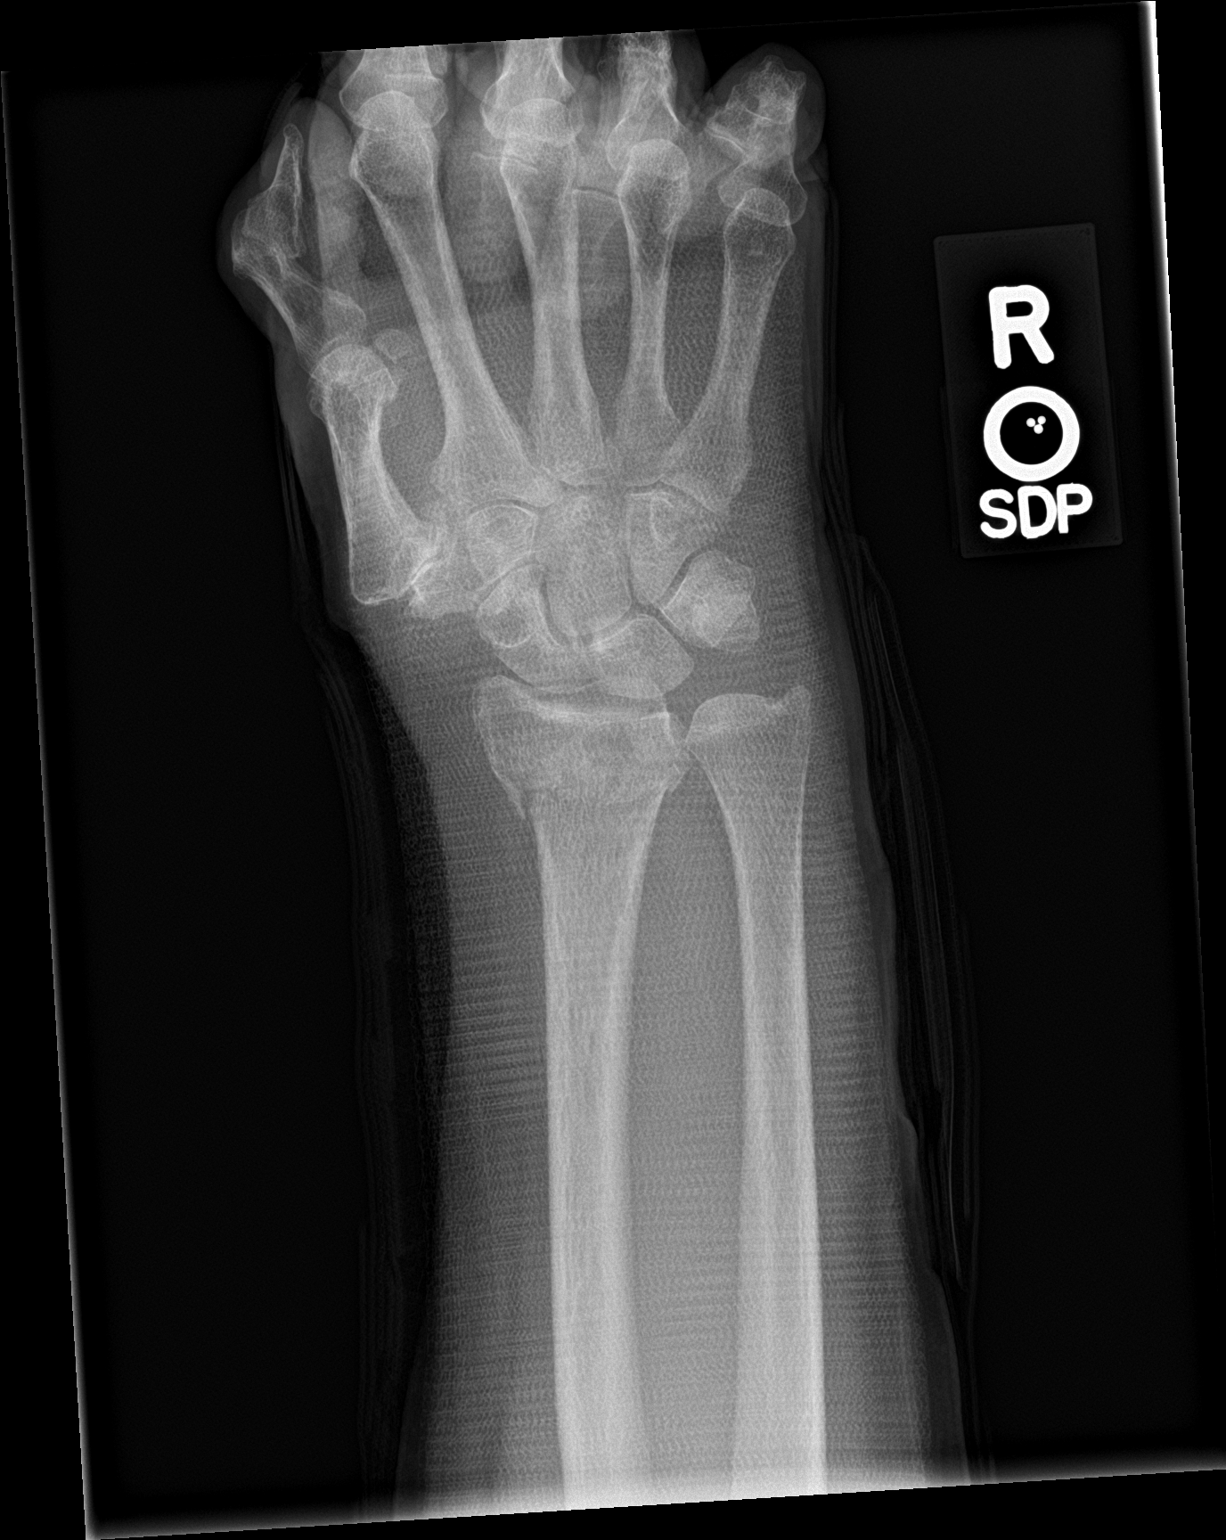

[wrist obl]
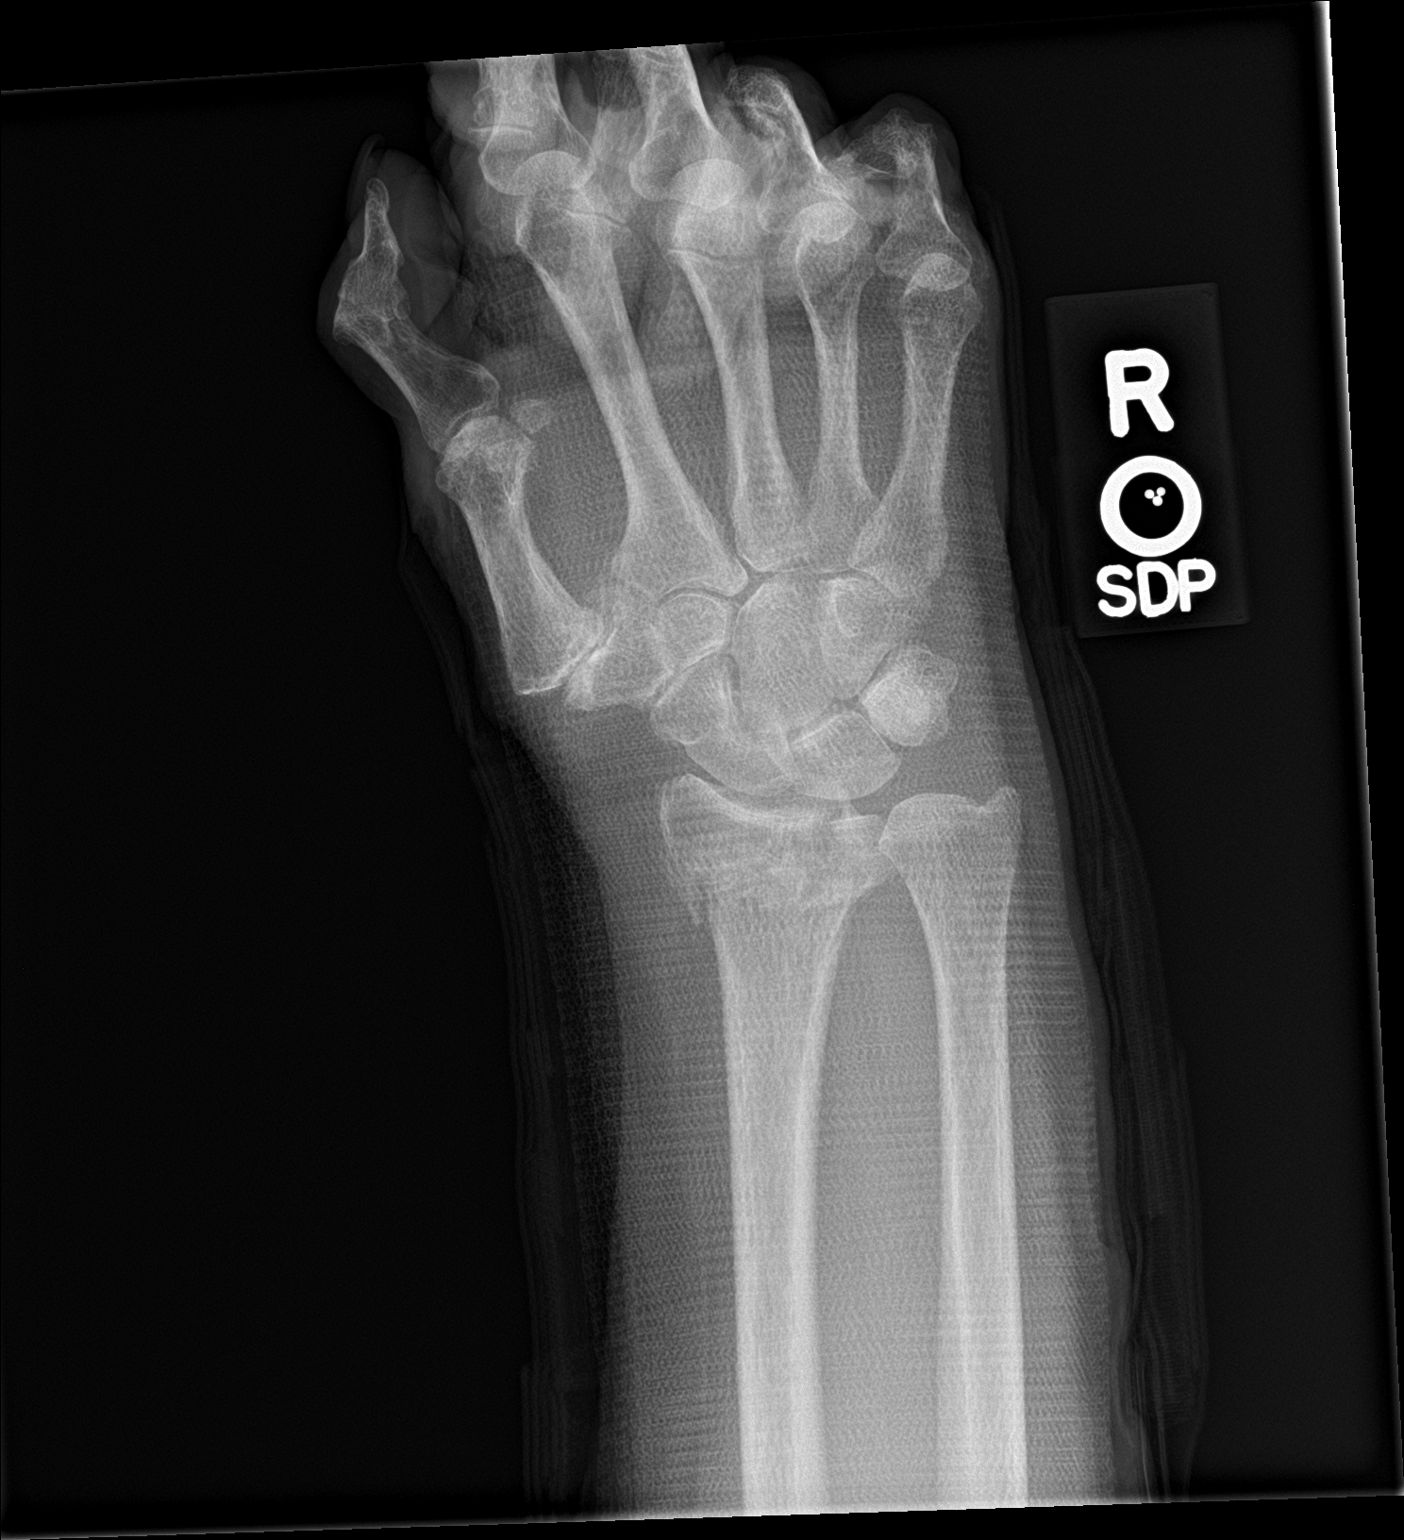

[wrist lat]
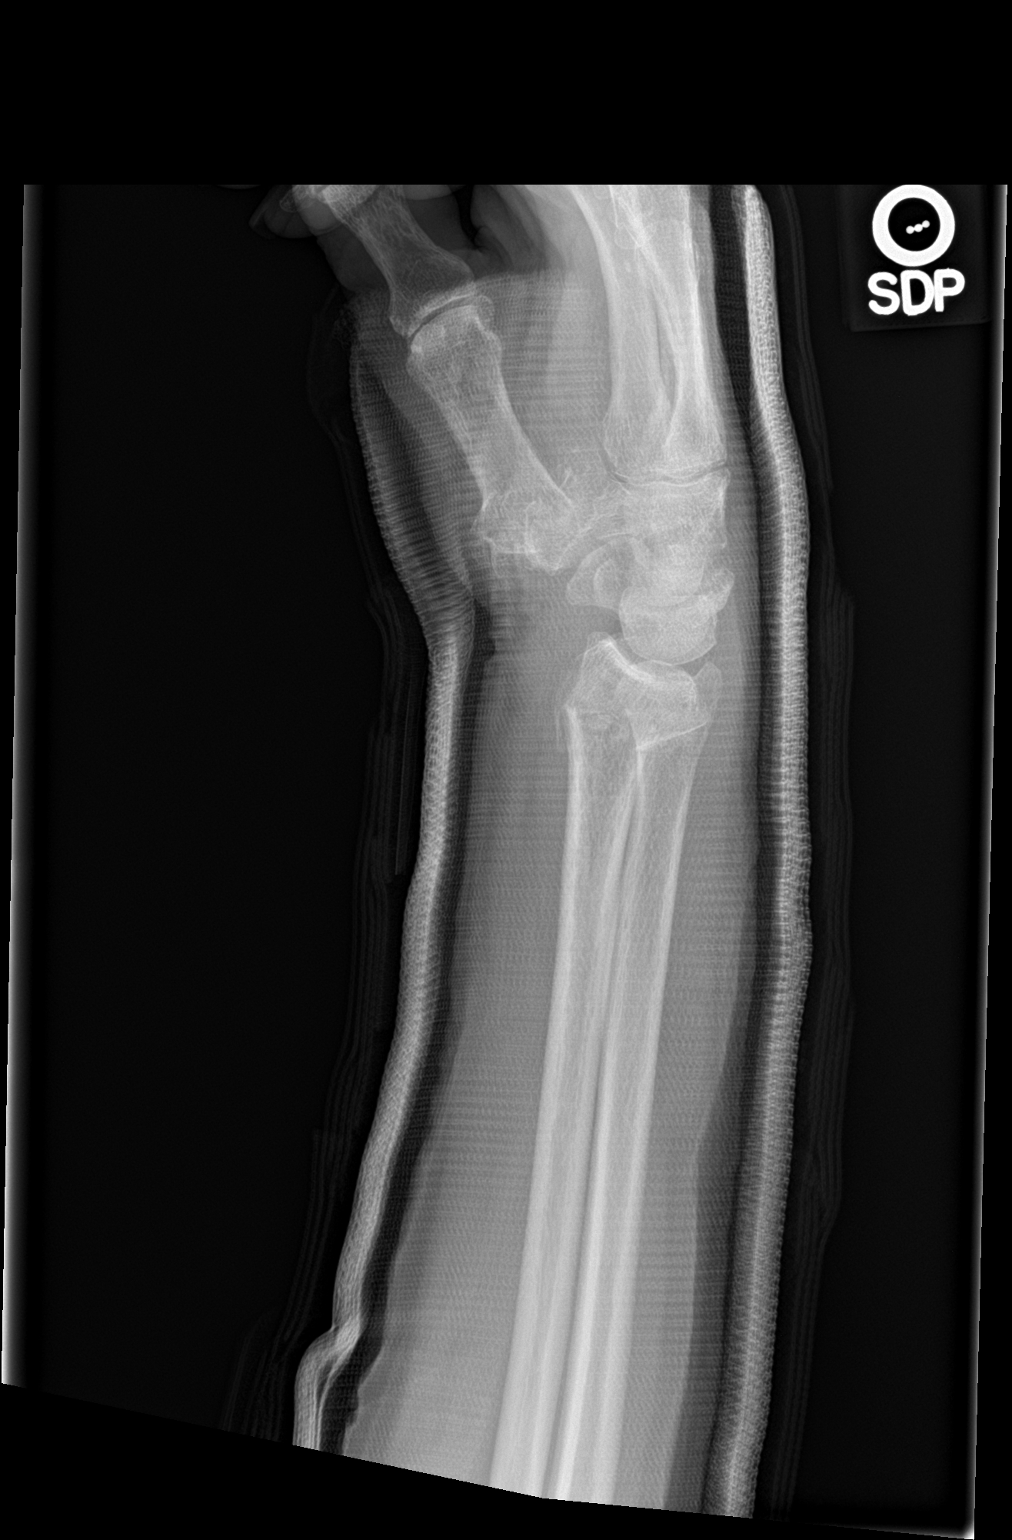

[3 of 3 positions shown; findings below may reference images not displayed]

FINDINGS: Comminuted fractures of the distal right radius with dorsal
angulation of the distal fracture fragments, mildly improved since
prior study. Cast material has been placed, obscuring some bone
detail.
IMPRESSION: Comminuted fractures of the distal right radius with dorsal
angulation, mildly improved since prior study.

## 2022-10-14 DIAGNOSIS — M8588 Other specified disorders of bone density and structure, other site: Secondary | ICD-10-CM | POA: Diagnosis not present

## 2022-10-14 DIAGNOSIS — M810 Age-related osteoporosis without current pathological fracture: Secondary | ICD-10-CM | POA: Diagnosis not present

## 2022-10-14 DIAGNOSIS — Z1231 Encounter for screening mammogram for malignant neoplasm of breast: Secondary | ICD-10-CM | POA: Diagnosis not present

## 2022-11-17 DIAGNOSIS — G8929 Other chronic pain: Secondary | ICD-10-CM | POA: Diagnosis not present

## 2022-11-17 DIAGNOSIS — M545 Low back pain, unspecified: Secondary | ICD-10-CM | POA: Diagnosis not present

## 2022-11-17 DIAGNOSIS — M818 Other osteoporosis without current pathological fracture: Secondary | ICD-10-CM | POA: Diagnosis not present

## 2022-12-23 ENCOUNTER — Ambulatory Visit
Admission: RE | Admit: 2022-12-23 | Discharge: 2022-12-23 | Disposition: A | Discharge status: Home / Self Care | Payer: Medicare Other | Source: Ambulatory Visit | Attending: Physician Assistant | Admitting: Physician Assistant

## 2022-12-23 ENCOUNTER — Other Ambulatory Visit: Payer: Self-pay | Admitting: Physician Assistant

## 2022-12-23 DIAGNOSIS — M4126 Other idiopathic scoliosis, lumbar region: Secondary | ICD-10-CM | POA: Diagnosis not present

## 2022-12-23 DIAGNOSIS — M810 Age-related osteoporosis without current pathological fracture: Secondary | ICD-10-CM | POA: Diagnosis not present

## 2022-12-23 DIAGNOSIS — M5442 Lumbago with sciatica, left side: Secondary | ICD-10-CM

## 2022-12-23 DIAGNOSIS — M545 Low back pain, unspecified: Secondary | ICD-10-CM | POA: Diagnosis not present

## 2023-02-23 DIAGNOSIS — Z23 Encounter for immunization: Secondary | ICD-10-CM | POA: Diagnosis not present

## 2023-06-22 DIAGNOSIS — H04123 Dry eye syndrome of bilateral lacrimal glands: Secondary | ICD-10-CM | POA: Diagnosis not present

## 2023-06-22 DIAGNOSIS — H2513 Age-related nuclear cataract, bilateral: Secondary | ICD-10-CM | POA: Diagnosis not present

## 2023-06-22 DIAGNOSIS — H40013 Open angle with borderline findings, low risk, bilateral: Secondary | ICD-10-CM | POA: Diagnosis not present

## 2023-09-02 DIAGNOSIS — Z23 Encounter for immunization: Secondary | ICD-10-CM | POA: Diagnosis not present

## 2023-09-02 DIAGNOSIS — I10 Essential (primary) hypertension: Secondary | ICD-10-CM | POA: Diagnosis not present

## 2023-09-02 DIAGNOSIS — E7800 Pure hypercholesterolemia, unspecified: Secondary | ICD-10-CM | POA: Diagnosis not present

## 2023-09-02 DIAGNOSIS — Z1331 Encounter for screening for depression: Secondary | ICD-10-CM | POA: Diagnosis not present

## 2023-09-02 DIAGNOSIS — M5441 Lumbago with sciatica, right side: Secondary | ICD-10-CM | POA: Diagnosis not present

## 2023-09-02 DIAGNOSIS — I868 Varicose veins of other specified sites: Secondary | ICD-10-CM | POA: Diagnosis not present

## 2023-09-02 DIAGNOSIS — Z0000 Encounter for general adult medical examination without abnormal findings: Secondary | ICD-10-CM | POA: Diagnosis not present

## 2023-09-02 DIAGNOSIS — M810 Age-related osteoporosis without current pathological fracture: Secondary | ICD-10-CM | POA: Diagnosis not present

## 2023-09-02 DIAGNOSIS — K219 Gastro-esophageal reflux disease without esophagitis: Secondary | ICD-10-CM | POA: Diagnosis not present

## 2023-09-02 DIAGNOSIS — M797 Fibromyalgia: Secondary | ICD-10-CM | POA: Diagnosis not present

## 2023-09-07 DIAGNOSIS — H40013 Open angle with borderline findings, low risk, bilateral: Secondary | ICD-10-CM | POA: Diagnosis not present

## 2023-10-20 DIAGNOSIS — M545 Low back pain, unspecified: Secondary | ICD-10-CM | POA: Diagnosis not present

## 2023-10-20 DIAGNOSIS — M25552 Pain in left hip: Secondary | ICD-10-CM | POA: Diagnosis not present

## 2023-10-22 DIAGNOSIS — M545 Low back pain, unspecified: Secondary | ICD-10-CM | POA: Diagnosis not present

## 2023-10-26 DIAGNOSIS — Z1231 Encounter for screening mammogram for malignant neoplasm of breast: Secondary | ICD-10-CM | POA: Diagnosis not present

## 2023-10-27 DIAGNOSIS — M545 Low back pain, unspecified: Secondary | ICD-10-CM | POA: Diagnosis not present

## 2023-11-10 DIAGNOSIS — M545 Low back pain, unspecified: Secondary | ICD-10-CM | POA: Diagnosis not present
